# Patient Record
Sex: Female | Born: 1995 | Race: White | Hispanic: No | Marital: Single | Smoking: Never smoker
Health system: Southern US, Community
[De-identification: ages and names within clinical notes are randomized; demographics above are authoritative.]

## PROBLEM LIST (undated history)

## (undated) DIAGNOSIS — F419 Anxiety disorder, unspecified: Secondary | ICD-10-CM

## (undated) DIAGNOSIS — F909 Attention-deficit hyperactivity disorder, unspecified type: Secondary | ICD-10-CM

## (undated) DIAGNOSIS — R519 Headache, unspecified: Secondary | ICD-10-CM

## (undated) DIAGNOSIS — T7840XA Allergy, unspecified, initial encounter: Secondary | ICD-10-CM

## (undated) DIAGNOSIS — R471 Dysarthria and anarthria: Secondary | ICD-10-CM

## (undated) HISTORY — DX: Attention-deficit hyperactivity disorder, unspecified type: F90.9

## (undated) HISTORY — DX: Dysarthria and anarthria: R47.1

## (undated) HISTORY — DX: Allergy, unspecified, initial encounter: T78.40XA

## (undated) HISTORY — PX: MOUTH SURGERY: SHX715

---
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
## 2016-04-19 ENCOUNTER — Encounter: Payer: Self-pay | Admitting: Women's Health

## 2016-04-19 ENCOUNTER — Ambulatory Visit (INDEPENDENT_AMBULATORY_CARE_PROVIDER_SITE_OTHER): Payer: 59 | Admitting: Women's Health

## 2016-04-19 VITALS — BP 110/80 | Ht 63.0 in | Wt 145.0 lb

## 2016-04-19 DIAGNOSIS — Z113 Encounter for screening for infections with a predominantly sexual mode of transmission: Secondary | ICD-10-CM

## 2016-04-19 DIAGNOSIS — Z01419 Encounter for gynecological examination (general) (routine) without abnormal findings: Secondary | ICD-10-CM

## 2016-04-19 DIAGNOSIS — Z30011 Encounter for initial prescription of contraceptive pills: Secondary | ICD-10-CM

## 2016-04-19 DIAGNOSIS — Z23 Encounter for immunization: Secondary | ICD-10-CM

## 2016-04-19 MED ORDER — NORETHIN ACE-ETH ESTRAD-FE 1-20 MG-MCG PO TABS: 1.0000 | ORAL_TABLET | Freq: Every day | ORAL | 4 refills | Status: DC

## 2016-04-19 NOTE — Addendum Note (Signed)
Addended by: Kem Parkinson on: 04/19/2016 04:41 PM   Modules accepted: Orders

## 2016-04-19 NOTE — Patient Instructions (Signed)
Health Maintenance, Female Adopting a healthy lifestyle and getting preventive care can go a long way to promote health and wellness. Talk with your health care provider about what schedule of regular examinations is right for you. This is a good chance for you to check in with your provider about disease prevention and staying healthy. In between checkups, there are plenty of things you can do on your own. Experts have done a lot of research about which lifestyle changes and preventive measures are most likely to keep you healthy. Ask your health care provider for more information. WEIGHT AND DIET  Eat a healthy diet  Be sure to include plenty of vegetables, fruits, low-fat dairy products, and lean protein.  Do not eat a lot of foods high in solid fats, added sugars, or salt.  Get regular exercise. This is one of the most important things you can do for your health.  Most adults should exercise for at least 150 minutes each week. The exercise should increase your heart rate and make you sweat (moderate-intensity exercise).  Most adults should also do strengthening exercises at least twice a week. This is in addition to the moderate-intensity exercise.  Maintain a healthy weight  Body mass index (BMI) is a measurement that can be used to identify possible weight problems. It estimates body fat based on height and weight. Your health care provider can help determine your BMI and help you achieve or maintain a healthy weight.  For females 20 years of age and older:   A BMI below 18.5 is considered underweight.  A BMI of 18.5 to 24.9 is normal.  A BMI of 25 to 29.9 is considered overweight.  A BMI of 30 and above is considered obese.  Watch levels of cholesterol and blood lipids  You should start having your blood tested for lipids and cholesterol at 20 years of age, then have this test every 5 years.  You may need to have your cholesterol levels checked more often if:  Your lipid  or cholesterol levels are high.  You are older than 19 years of age.  You are at high risk for heart disease.  CANCER SCREENING   Lung Cancer  Lung cancer screening is recommended for adults 55-80 years old who are at high risk for lung cancer because of a history of smoking.  A yearly low-dose CT scan of the lungs is recommended for people who:  Currently smoke.  Have quit within the past 15 years.  Have at least a 30-pack-year history of smoking. A pack year is smoking an average of one pack of cigarettes a day for 1 year.  Yearly screening should continue until it has been 15 years since you quit.  Yearly screening should stop if you develop a health problem that would prevent you from having lung cancer treatment.  Breast Cancer  Practice breast self-awareness. This means understanding how your breasts normally appear and feel.  It also means doing regular breast self-exams. Let your health care provider know about any changes, no matter how small.  If you are in your 20s or 30s, you should have a clinical breast exam (CBE) by a health care provider every 1-3 years as part of a regular health exam.  If you are 40 or older, have a CBE every year. Also consider having a breast X-ray (mammogram) every year.  If you have a family history of breast cancer, talk to your health care provider about genetic screening.  If you   are at high risk for breast cancer, talk to your health care provider about having an MRI and a mammogram every year.  Breast cancer gene (BRCA) assessment is recommended for women who have family members with BRCA-related cancers. BRCA-related cancers include:  Breast.  Ovarian.  Tubal.  Peritoneal cancers.  Results of the assessment will determine the need for genetic counseling and BRCA1 and BRCA2 testing. Cervical Cancer Your health care provider may recommend that you be screened regularly for cancer of the pelvic organs (ovaries, uterus, and  vagina). This screening involves a pelvic examination, including checking for microscopic changes to the surface of your cervix (Pap test). You may be encouraged to have this screening done every 3 years, beginning at age 21.  For women ages 30-65, health care providers may recommend pelvic exams and Pap testing every 3 years, or they may recommend the Pap and pelvic exam, combined with testing for human papilloma virus (HPV), every 5 years. Some types of HPV increase your risk of cervical cancer. Testing for HPV may also be done on women of any age with unclear Pap test results.  Other health care providers may not recommend any screening for nonpregnant women who are considered low risk for pelvic cancer and who do not have symptoms. Ask your health care provider if a screening pelvic exam is right for you.  If you have had past treatment for cervical cancer or a condition that could lead to cancer, you need Pap tests and screening for cancer for at least 20 years after your treatment. If Pap tests have been discontinued, your risk factors (such as having a new sexual partner) need to be reassessed to determine if screening should resume. Some women have medical problems that increase the chance of getting cervical cancer. In these cases, your health care provider may recommend more frequent screening and Pap tests. Colorectal Cancer  This type of cancer can be detected and often prevented.  Routine colorectal cancer screening usually begins at 19 years of age and continues through 19 years of age.  Your health care provider may recommend screening at an earlier age if you have risk factors for colon cancer.  Your health care provider may also recommend using home test kits to check for hidden blood in the stool.  A small camera at the end of a tube can be used to examine your colon directly (sigmoidoscopy or colonoscopy). This is done to check for the earliest forms of colorectal  cancer.  Routine screening usually begins at age 50.  Direct examination of the colon should be repeated every 5-10 years through 19 years of age. However, you may need to be screened more often if early forms of precancerous polyps or small growths are found. Skin Cancer  Check your skin from head to toe regularly.  Tell your health care provider about any new moles or changes in moles, especially if there is a change in a mole's shape or color.  Also tell your health care provider if you have a mole that is larger than the size of a pencil eraser.  Always use sunscreen. Apply sunscreen liberally and repeatedly throughout the day.  Protect yourself by wearing long sleeves, pants, a wide-brimmed hat, and sunglasses whenever you are outside. HEART DISEASE, DIABETES, AND HIGH BLOOD PRESSURE   High blood pressure causes heart disease and increases the risk of stroke. High blood pressure is more likely to develop in:  People who have blood pressure in the high end   of the normal range (130-139/85-89 mm Hg).  People who are overweight or obese.  People who are African American.  If you are 38-23 years of age, have your blood pressure checked every 3-5 years. If you are 61 years of age or older, have your blood pressure checked every year. You should have your blood pressure measured twice--once when you are at a hospital or clinic, and once when you are not at a hospital or clinic. Record the average of the two measurements. To check your blood pressure when you are not at a hospital or clinic, you can use:  An automated blood pressure machine at a pharmacy.  A home blood pressure monitor.  If you are between 45 years and 39 years old, ask your health care provider if you should take aspirin to prevent strokes.  Have regular diabetes screenings. This involves taking a blood sample to check your fasting blood sugar level.  If you are at a normal weight and have a low risk for diabetes,  have this test once every three years after 19 years of age.  If you are overweight and have a high risk for diabetes, consider being tested at a younger age or more often. PREVENTING INFECTION  Hepatitis B  If you have a higher risk for hepatitis B, you should be screened for this virus. You are considered at high risk for hepatitis B if:  You were born in a country where hepatitis B is common. Ask your health care provider which countries are considered high risk.  Your parents were born in a high-risk country, and you have not been immunized against hepatitis B (hepatitis B vaccine).  You have HIV or AIDS.  You use needles to inject street drugs.  You live with someone who has hepatitis B.  You have had sex with someone who has hepatitis B.  You get hemodialysis treatment.  You take certain medicines for conditions, including cancer, organ transplantation, and autoimmune conditions. Hepatitis C  Blood testing is recommended for:  Everyone born from [birth year] through 1964.  Anyone with known risk factors for hepatitis C. Sexually transmitted infections (STIs)  You should be screened for sexually transmitted infections (STIs) including gonorrhea and chlamydia if:  You are sexually active and are younger than 19 years of age.  You are older than 19 years of age and your health care provider tells you that you are at risk for this type of infection.  Your sexual activity has changed since you were last screened and you are at an increased risk for chlamydia or gonorrhea. Ask your health care provider if you are at risk.  If you do not have HIV, but are at risk, it may be recommended that you take a prescription medicine daily to prevent HIV infection. This is called pre-exposure prophylaxis (PrEP). You are considered at risk if:  You are sexually active and do not regularly use condoms or know the HIV status of your partner(s).  You take drugs by injection.  You are sexually  active with a partner who has HIV. Talk with your health care provider about whether you are at high risk of being infected with HIV. If you choose to begin PrEP, you should first be tested for HIV. You should then be tested every 3 months for as long as you are taking PrEP.  PREGNANCY   If you are premenopausal and you may become pregnant, ask your health care provider about preconception counseling.  If you may  become pregnant, take 400 to 800 micrograms (mcg) of folic acid every day.  If you want to prevent pregnancy, talk to your health care provider about birth control (contraception). OSTEOPOROSIS AND MENOPAUSE   Osteoporosis is a disease in which the bones lose minerals and strength with aging. This can result in serious bone fractures. Your risk for osteoporosis can be identified using a bone density scan.  If you are 65 years of age or older, or if you are at risk for osteoporosis and fractures, ask your health care provider if you should be screened.  Ask your health care provider whether you should take a calcium or vitamin D supplement to lower your risk for osteoporosis.  Menopause may have certain physical symptoms and risks.  Hormone replacement therapy may reduce some of these symptoms and risks. Talk to your health care provider about whether hormone replacement therapy is right for you.  HOME CARE INSTRUCTIONS   Schedule regular health, dental, and eye exams.  Stay current with your immunizations.   Do not use any tobacco products including cigarettes, chewing tobacco, or electronic cigarettes.  If you are pregnant, do not drink alcohol.  If you are breastfeeding, limit how much and how often you drink alcohol.  Limit alcohol intake to no more than 1 drink per day for nonpregnant women. One drink equals 12 ounces of beer, 5 ounces of wine, or 1 ounces of hard liquor.  Do not use street drugs.  Do not share needles.  Ask your health care provider for help if  you need support or information about quitting drugs.  Tell your health care provider if you often feel depressed.  Tell your health care provider if you have ever been abused or do not feel safe at home.   This information is not intended to replace advice given to you by your health care provider. Make sure you discuss any questions you have with your health care provider.   Document Released: 03/21/2011 Document Revised: 09/26/2014 Document Reviewed: 08/07/2013 Elsevier Interactive Patient Education 2016 Elsevier Inc.  HPV (Human Papillomavirus) Vaccine--Gardasil-9:  1. Why get vaccinated? Gardasil-9 prevents human papillomavirus (HPV) types that cause many cancers, including:  cervical cancer in females,  vaginal and vulvar cancers in females,  anal cancer in females and males,  throat cancer in females and males, and  penile cancer in males. In addition, Gardasil-9 prevents HPV types that cause genital warts in both females and males. In the [place] about 12,000 women get cervical cancer every year, and about 4,000 women die from it. Gardasil-9 can prevent most of these cases of cervical cancer. Vaccination is not a substitute for cervical cancer screening. This vaccine does not protect against all HPV types that can cause cervical cancer. Women should still get regular Pap tests. HPV infection usually comes from sexual contact, and most people will become infected at some point in their life. About 14 million Americans, including teens, get infected every year. Most infections will go away and not cause serious problems. But thousands of women and men get cancer and diseases from HPV. 2. HPV vaccine Gardasil-9 is an FDA-approved HPV vaccine. It is recommended for both males and females. It is routinely given at 11 or 19 years of age, but it may be given beginning at age 9 years through age 26 years. Three doses of Gardasil-9 are recommended with the second dose given 1-2 months  after the first dose and the third dose given 6 months after the first   dose. 3. Some people should not get this vaccine  Anyone who has had a severe, life-threatening allergic reaction to a dose of HPV vaccine should not get another dose.  Anyone who has a severe (life threatening) allergy to any component of HPV vaccine should not get the vaccine. Tell your doctor if you have any severe allergies that you know of, including a severe allergy to yeast.  HPV vaccine is not recommended for pregnant women. If you learn that you were pregnant when you were vaccinated, there is no reason to expect any problems for you or your baby. Any woman who learns she was pregnant when she got Gardasil-9 vaccine is encouraged to contact the manufacturer's registry for HPV vaccination during pregnancy at 1-800-986-8999. Women who are breastfeeding may be vaccinated.  If you have a mild illness, such as a cold, you can probably get the vaccine today. If you are moderately or severely ill, you should probably wait until you recover. Your doctor can advise you. 4. Risks of a vaccine reaction With any medicine, including vaccines, there is a chance of side effects. These are usually mild and go away on their own, but serious reactions are also possible. Most people who get HPV vaccine do not have any serious problems with it. Mild or moderate problems following Gardasil-9:  Reactions in the arm where the shot was given:  Soreness (about 9 people in 10)  Redness or swelling (about 1 person in 3)  Fever:  Mild (100F) (about 1 person in 10)  Moderate (102F) (about 1 person in 65)  Other problems:  Headache (about 1 person in 3) Problems that could happen after any injected vaccine:  People sometimes faint after a medical procedure, including vaccination. Sitting or lying down for about 15 minutes can help prevent fainting, and injuries caused by a fall. Tell your doctor if you feel dizzy, or have vision  changes or ringing in the ears.  Some people get severe pain in the shoulder and have difficulty moving the arm where a shot was given. This happens very rarely.  Any medication can cause a severe allergic reaction. Such reactions from a vaccine are very rare, estimated at about 1 in a million doses, and would happen within a few minutes to a few hours after the vaccination. As with any medicine, there is a very remote chance of a vaccine causing a serious injury or death. The safety of vaccines is always being monitored. For more information, visit: www.cdc.gov/vaccinesafety/. 5. What if there is a serious reaction? What should I look for? Look for anything that concerns you, such as signs of a severe allergic reaction, very high fever, or unusual behavior. Signs of a severe allergic reaction can include hives, swelling of the face and throat, difficulty breathing, a fast heartbeat, dizziness, and weakness. These would usually start a few minutes to a few hours after the vaccination. What should I do? If you think it is a severe allergic reaction or other emergency that can't wait, call 9-1-1 or get to the nearest hospital. Otherwise, call your doctor. Afterward, the reaction should be reported to the "Vaccine Adverse Event Reporting System" (VAERS). Your doctor might file this report, or you can do it yourself through the VAERS web site at www.vaers.hhs.gov, or by calling 1-800-822-7967. VAERS does not give medical advice. 6. The National Vaccine Injury Compensation Program The National Vaccine Injury Compensation Program (VICP) is a federal program that was created to compensate people who may have been   injured by certain vaccines. Persons who believe they may have been injured by a vaccine can learn about the program and about filing a claim by calling 1-800-338-2382 or visiting the VICP website at www.hrsa.gov/vaccinecompensation. There is a time limit to file a claim for compensation. 7. How  can I learn more?  Ask your health care provider. He or she can give you the vaccine package insert or suggest other sources of information.  Call your local or state health department.  Contact the Centers for Disease Control and Prevention (CDC):  Call 1-800-232-4636 (1-800-CDC-INFO) or  Visit CDC's website at www.cdc.gov/hpv Vaccine Information Statement HPV Vaccine (Gardasil-9) 12/18/14   This information is not intended to replace advice given to you by your health care provider. Make sure you discuss any questions you have with your health care provider.   Document Released: 04/02/2014 Document Revised: 01/20/2015 Document Reviewed: 04/02/2014 Elsevier Interactive Patient Education 2016 Elsevier Inc.  

## 2016-04-19 NOTE — Progress Notes (Signed)
Lisa Duncan 1996/01/12 [id number]    History:    Presents for annual exam.  Monthly cycles/condoms/new partner. Has not received gardasil.  Past medical history, past surgical history, family history and social history were all reviewed and documented in the EPIC chart. 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  ROS:  A ROS was performed and pertinent positives and negatives are included.  Exam:  Vitals:   04/19/16 1428  BP: 110/80     General appearance:  Normal Thyroid:  Symmetrical, normal in size, without palpable masses or nodularity. Respiratory  Auscultation:  Clear without wheezing or rhonchi Cardiovascular  Auscultation:  Regular rate, without rubs, murmurs or gallops  Edema/varicosities:  Not grossly evident Abdominal  Soft,nontender, without masses, guarding or rebound.  Liver/spleen:  No organomegaly noted  Hernia:  None appreciated  Skin  Inspection:  Grossly normal   Breasts: Examined lying and sitting.     Right: Without masses, retractions, discharge or axillary adenopathy.     Left: Without masses, retractions, discharge or axillary adenopathy. Gentitourinary   Inguinal/mons:  Normal without inguinal adenopathy  External genitalia:  Normal  BUS/Urethra/Skene's glands:  Normal  Vagina:  Normal  Cervix:  Normal  Uterus:  normal in size, shape and contour.  Midline and mobile  Adnexa/parametria:     Rt: Without masses or tenderness.   Lt: Without masses or tenderness.  Anus and perineum: Normal    Assessment/Plan:  20 y.o. SWF G0 for annual exam with no complaints.  Monthly cycle/condoms STD screen Contraception management Gardasil  Plan: Contraception options reviewed, would like to try a pill, Loestrin 1/20 prescription, proper use, slight risk for blood clots and strokes reviewed start up instructions reviewed. Importance of condoms especially first month and for infection control reviewed. SBE's, exercise, calcium rich diet, MVI daily encouraged.  Campus and dating safety reviewed. CBC, UA, GC/Chlamydia, HIV, hep B, C, RPR. First gardasil given today, instructed to return to office in 2 and 6 months to complete series.    Harrington Challenger [facility], 3:04 PM 04/19/2016

## 2016-04-20 ENCOUNTER — Telehealth: Payer: Self-pay | Admitting: *Deleted

## 2016-04-20 LAB — URINALYSIS W MICROSCOPIC + REFLEX CULTURE
Bacteria, UA: NONE SEEN [HPF]
Bilirubin Urine: NEGATIVE
CASTS: NONE SEEN [LPF]
CRYSTALS: NONE SEEN [HPF]
Glucose, UA: NEGATIVE
HGB URINE DIPSTICK: NEGATIVE
KETONES UR: NEGATIVE
Leukocytes, UA: NEGATIVE
Nitrite: NEGATIVE
PROTEIN: NEGATIVE
RBC / HPF: NONE SEEN RBC/HPF (ref <=2)
Specific Gravity, Urine: 1.023 (ref 1.001–1.035)
WBC, UA: NONE SEEN WBC/HPF (ref <=5)
Yeast: NONE SEEN [HPF]
pH: 6 (ref 5.0–8.0)

## 2016-04-20 LAB — CBC WITH DIFFERENTIAL/PLATELET
BASOS PCT: 1 %
Basophils Absolute: 69 cells/uL (ref 0–200)
EOS PCT: 2 %
Eosinophils Absolute: 138 cells/uL (ref 15–500)
HCT: 38.1 % (ref 35.0–45.0)
HEMOGLOBIN: 12.6 g/dL (ref 11.7–15.5)
LYMPHS PCT: 39 %
Lymphs Abs: 2691 cells/uL (ref 850–3900)
MCH: 28 pg (ref 27.0–33.0)
MCHC: 33.1 g/dL (ref 32.0–36.0)
MCV: 84.7 fL (ref 80.0–100.0)
MONO ABS: 483 {cells}/uL (ref 200–950)
MPV: 9.4 fL (ref 7.5–12.5)
Monocytes Relative: 7 %
NEUTROS PCT: 51 %
Neutro Abs: 3519 cells/uL (ref 1500–7800)
Platelets: 360 10*3/uL (ref 140–400)
RBC: 4.5 MIL/uL (ref 3.80–5.10)
RDW: 13.6 % (ref 11.0–15.0)
WBC: 6.9 10*3/uL (ref 3.8–10.8)

## 2016-04-20 LAB — RPR

## 2016-04-20 LAB — GC/CHLAMYDIA PROBE AMP
CT PROBE, AMP APTIMA: NOT DETECTED
GC PROBE AMP APTIMA: NOT DETECTED

## 2016-04-20 LAB — HIV ANTIBODY (ROUTINE TESTING W REFLEX): HIV 1&2 Ab, 4th Generation: NONREACTIVE

## 2016-04-20 LAB — HEPATITIS B SURFACE ANTIGEN: HEP B S AG: NEGATIVE

## 2016-04-20 LAB — HEPATITIS C ANTIBODY: HCV Ab: NEGATIVE

## 2016-04-20 NOTE — Telephone Encounter (Signed)
Pt called states OCP too expensive over $100.  Her sister gets Loestrin 1.5/30 for $25 ok to call in? pls advise KW CMA

## 2016-04-20 NOTE — Telephone Encounter (Signed)
Yes okay to call and it is minimally different than what was originally prescribed both are generic Loestrin. Ortho-Cyclen/Sprintec could be less expensive possibly $10-$12 at [facility] even without insurance would be fine also. Both are good pills.

## 2016-04-21 MED ORDER — NORETHIN ACE-ETH ESTRAD-FE 1.5-30 MG-MCG PO TABS: 1.0000 | ORAL_TABLET | Freq: Every day | ORAL | 11 refills | Status: DC

## 2016-04-21 NOTE — Telephone Encounter (Signed)
Rx was sent by Maryelizabeth Rowan

## 2016-04-22 ENCOUNTER — Ambulatory Visit: Payer: Managed Care, Other (non HMO) | Admitting: Family Medicine

## 2016-04-22 DIAGNOSIS — Z0289 Encounter for other administrative examinations: Secondary | ICD-10-CM

## 2016-06-20 ENCOUNTER — Telehealth: Payer: Self-pay | Admitting: *Deleted

## 2016-06-20 MED ORDER — DROSPIRENONE-ETHINYL ESTRADIOL 3-0.02 MG PO TABS: 1.0000 | ORAL_TABLET | Freq: Every day | ORAL | 11 refills | Status: DC

## 2016-06-20 NOTE — Telephone Encounter (Signed)
Ok, have her try Yaz, please call in rx for her, instruct to complete current pak, then start.  thanks

## 2016-06-20 NOTE — Telephone Encounter (Signed)
Pt informed with the below note, Rx sent. 

## 2016-06-20 NOTE — Telephone Encounter (Signed)
Pt was prescribed microgestin in august stating pills have made her feel very emotional, would prefer to switch to different pill. Please advise

## 2016-06-24 ENCOUNTER — Other Ambulatory Visit: Payer: Self-pay | Admitting: Women's Health

## 2016-06-24 ENCOUNTER — Ambulatory Visit (INDEPENDENT_AMBULATORY_CARE_PROVIDER_SITE_OTHER): Payer: 59 | Admitting: *Deleted

## 2016-06-24 DIAGNOSIS — Z23 Encounter for immunization: Secondary | ICD-10-CM

## 2016-06-24 MED ORDER — NORGESTIMATE-ETH ESTRADIOL 0.25-35 MG-MCG PO TABS: 1.0000 | ORAL_TABLET | Freq: Every day | ORAL | 12 refills | Status: DC

## 2016-10-18 ENCOUNTER — Telehealth: Payer: Self-pay | Admitting: *Deleted

## 2016-10-18 MED ORDER — NORETHINDRONE ACET-ETHINYL EST 1-20 MG-MCG PO TABS: 1.0000 | ORAL_TABLET | Freq: Every day | ORAL | 7 refills | Status: DC

## 2016-10-18 NOTE — Telephone Encounter (Signed)
Review Yaz is a 20 g pill should be weight neutral, could try OCP with a different progestin, Loestrin 1/20 one daily, refills until next annual exam. Continue healthy lifestyle.

## 2016-10-18 NOTE — Telephone Encounter (Signed)
Pt informed with the below note, Rx sent. 

## 2016-10-18 NOTE — Telephone Encounter (Signed)
Pt has been taking Yaz since Oct. 2017 would like to switch to another birth control, states she has gained weight, was 145lb, now 160lb. Pt said she works out, eating good. Please advise

## 2016-10-21 ENCOUNTER — Ambulatory Visit (INDEPENDENT_AMBULATORY_CARE_PROVIDER_SITE_OTHER): Payer: BLUE CROSS/BLUE SHIELD | Admitting: Anesthesiology

## 2016-10-21 DIAGNOSIS — Z23 Encounter for immunization: Secondary | ICD-10-CM

## 2017-02-01 ENCOUNTER — Encounter: Payer: Self-pay | Admitting: Gynecology

## 2017-04-13 ENCOUNTER — Other Ambulatory Visit: Payer: Self-pay | Admitting: Women's Health

## 2017-04-19 ENCOUNTER — Encounter: Payer: BLUE CROSS/BLUE SHIELD | Admitting: Women's Health

## 2017-05-01 ENCOUNTER — Other Ambulatory Visit: Payer: Self-pay | Admitting: Women's Health

## 2017-05-10 ENCOUNTER — Ambulatory Visit (INDEPENDENT_AMBULATORY_CARE_PROVIDER_SITE_OTHER): Payer: BLUE CROSS/BLUE SHIELD | Admitting: Women's Health

## 2017-05-10 ENCOUNTER — Encounter: Payer: Self-pay | Admitting: Women's Health

## 2017-05-10 VITALS — BP 118/78 | Ht 63.0 in | Wt 161.0 lb

## 2017-05-10 DIAGNOSIS — Z01419 Encounter for gynecological examination (general) (routine) without abnormal findings: Secondary | ICD-10-CM

## 2017-05-10 DIAGNOSIS — Z3041 Encounter for surveillance of contraceptive pills: Secondary | ICD-10-CM

## 2017-05-10 LAB — CBC WITH DIFFERENTIAL/PLATELET
Basophils Absolute: 0 cells/uL (ref 0–200)
Basophils Relative: 0 %
EOS ABS: 73 {cells}/uL (ref 15–500)
Eosinophils Relative: 1 %
HEMATOCRIT: 35.5 % (ref 35.0–45.0)
Hemoglobin: 12.1 g/dL (ref 11.7–15.5)
Lymphocytes Relative: 32 %
Lymphs Abs: 2336 cells/uL (ref 850–3900)
MCH: 29.1 pg (ref 27.0–33.0)
MCHC: 34.1 g/dL (ref 32.0–36.0)
MCV: 85.3 fL (ref 80.0–100.0)
MONO ABS: 365 {cells}/uL (ref 200–950)
MPV: 9.5 fL (ref 7.5–12.5)
Monocytes Relative: 5 %
NEUTROS ABS: 4526 {cells}/uL (ref 1500–7800)
Neutrophils Relative %: 62 %
PLATELETS: 306 10*3/uL (ref 140–400)
RBC: 4.16 MIL/uL (ref 3.80–5.10)
RDW: 12.7 % (ref 11.0–15.0)
WBC: 7.3 10*3/uL (ref 3.8–10.8)

## 2017-05-10 MED ORDER — NORETHINDRONE ACET-ETHINYL EST 1-20 MG-MCG PO TABS: 1.0000 | ORAL_TABLET | Freq: Every day | ORAL | 4 refills | Status: DC

## 2017-05-10 NOTE — Patient Instructions (Signed)

## 2017-05-10 NOTE — Progress Notes (Signed)
Lisa Duncan 03/27/1996 [id number]    History:    Presents for annual exam.  Monthly cycle on Junel. Gardasil series completed. Negative STD screen with current partner.  Past medical history, past surgical history, family history and social history were all reviewed and documented in the EPIC chart. 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  ROS:  A ROS was performed and pertinent positives and negatives are included.  Exam:  Vitals:   05/10/17 0815  BP: 118/78  Weight: 161 lb (73 kg)  Height: 5\' 3"  (1.6 m)   Body mass index is 28.52 kg/m.   General appearance:  Normal Thyroid:  Symmetrical, normal in size, without palpable masses or nodularity. Respiratory  Auscultation:  Clear without wheezing or rhonchi Cardiovascular  Auscultation:  Regular rate, without rubs, murmurs or gallops  Edema/varicosities:  Not grossly evident Abdominal  Soft,nontender, without masses, guarding or rebound.  Liver/spleen:  No organomegaly noted  Hernia:  None appreciated  Skin  Inspection:  Grossly normal   Breasts: Examined lying and sitting.     Right: Without masses, retractions, discharge or axillary adenopathy.     Left: Without masses, retractions, discharge or axillary adenopathy. Gentitourinary   Inguinal/mons:  Normal without inguinal adenopathy  External genitalia:  Normal  BUS/Urethra/Skene's glands:  Normal  Vagina:  Normal  Cervix:  Normal  Uterus:  normal in size, shape and contour.  Midline and mobile  Adnexa/parametria:     Rt: Without masses or tenderness.   Lt: Without masses or tenderness.  Anus and perineum: Normal    Assessment/Plan:  21 y.o. S WF G0  for annual exam with no complaints.  Monthly cycle on OCs  Plan: Junel prescription, proper use, slight risk for blood clots and strikes reviewed. Condoms encouraged until permanent partner. SBE's, exercise, calcium rich diet, MVI daily encouraged. Reviewed importance of decreasing  calories and increasing exercise, has gained 15 pounds in the past year. CBC. Denies need for repeat STD screening/same partner  Harrington Challenger [facility], 8:38 AM 05/10/2017

## 2017-05-11 LAB — URINALYSIS W MICROSCOPIC + REFLEX CULTURE
Bacteria, UA: NONE SEEN [HPF]
Bilirubin Urine: NEGATIVE
CRYSTALS: NONE SEEN [HPF]
Casts: NONE SEEN [LPF]
Glucose, UA: NEGATIVE
Hgb urine dipstick: NEGATIVE
KETONES UR: NEGATIVE
Leukocytes, UA: NEGATIVE
Nitrite: NEGATIVE
Protein, ur: NEGATIVE
RBC / HPF: NONE SEEN RBC/HPF (ref <=2)
SPECIFIC GRAVITY, URINE: 1.022 (ref 1.001–1.035)
WBC, UA: NONE SEEN WBC/HPF (ref <=5)
Yeast: NONE SEEN [HPF]
pH: 6.5 (ref 5.0–8.0)

## 2017-08-20 ENCOUNTER — Other Ambulatory Visit: Payer: Self-pay | Admitting: Women's Health

## 2017-08-20 DIAGNOSIS — Z3041 Encounter for surveillance of contraceptive pills: Secondary | ICD-10-CM

## 2017-12-17 ENCOUNTER — Other Ambulatory Visit: Payer: Self-pay | Admitting: Women's Health

## 2017-12-17 DIAGNOSIS — Z3041 Encounter for surveillance of contraceptive pills: Secondary | ICD-10-CM

## 2018-04-14 ENCOUNTER — Other Ambulatory Visit: Payer: Self-pay | Admitting: Women's Health

## 2018-04-14 DIAGNOSIS — Z3041 Encounter for surveillance of contraceptive pills: Secondary | ICD-10-CM

## 2018-05-20 ENCOUNTER — Other Ambulatory Visit: Payer: Self-pay | Admitting: Women's Health

## 2018-05-20 DIAGNOSIS — Z3041 Encounter for surveillance of contraceptive pills: Secondary | ICD-10-CM

## 2018-05-22 NOTE — Telephone Encounter (Signed)
Appointment on 06/25/18

## 2018-05-23 DIAGNOSIS — J300 Vasomotor rhinitis: Secondary | ICD-10-CM | POA: Diagnosis not present

## 2018-06-25 ENCOUNTER — Encounter: Payer: Self-pay | Admitting: Women's Health

## 2018-06-25 ENCOUNTER — Other Ambulatory Visit: Payer: Self-pay | Admitting: *Deleted

## 2018-06-25 ENCOUNTER — Ambulatory Visit: Payer: BLUE CROSS/BLUE SHIELD | Admitting: Women's Health

## 2018-06-25 VITALS — BP 122/80 | Ht 63.0 in | Wt 169.0 lb

## 2018-06-25 DIAGNOSIS — Z01419 Encounter for gynecological examination (general) (routine) without abnormal findings: Secondary | ICD-10-CM | POA: Diagnosis not present

## 2018-06-25 DIAGNOSIS — Z3041 Encounter for surveillance of contraceptive pills: Secondary | ICD-10-CM

## 2018-06-25 LAB — CBC WITH DIFFERENTIAL/PLATELET
BASOS ABS: 68 {cells}/uL (ref 0–200)
Basophils Relative: 0.8 %
EOS ABS: 128 {cells}/uL (ref 15–500)
Eosinophils Relative: 1.5 %
HCT: 36.9 % (ref 35.0–45.0)
HEMOGLOBIN: 12.4 g/dL (ref 11.7–15.5)
Lymphs Abs: 3060 cells/uL (ref 850–3900)
MCH: 28.6 pg (ref 27.0–33.0)
MCHC: 33.6 g/dL (ref 32.0–36.0)
MCV: 85.2 fL (ref 80.0–100.0)
MPV: 10.1 fL (ref 7.5–12.5)
Monocytes Relative: 5.3 %
Neutro Abs: 4794 cells/uL (ref 1500–7800)
Neutrophils Relative %: 56.4 %
Platelets: 370 10*3/uL (ref 140–400)
RBC: 4.33 10*6/uL (ref 3.80–5.10)
RDW: 12.1 % (ref 11.0–15.0)
TOTAL LYMPHOCYTE: 36 %
WBC mixed population: 451 cells/uL (ref 200–950)
WBC: 8.5 10*3/uL (ref 3.8–10.8)

## 2018-06-25 MED ORDER — NORETHINDRONE ACET-ETHINYL EST 1-20 MG-MCG PO TABS: 1.0000 | ORAL_TABLET | Freq: Every day | ORAL | 4 refills | Status: DC

## 2018-06-25 NOTE — Addendum Note (Signed)
Addended by: Tito Dine on: 06/25/2018 09:15 AM   Modules accepted: Orders
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
## 2018-06-25 NOTE — Patient Instructions (Signed)

## 2018-06-27 LAB — PAP IG W/ RFLX HPV ASCU

## 2018-12-06 DIAGNOSIS — R591 Generalized enlarged lymph nodes: Secondary | ICD-10-CM | POA: Diagnosis not present

## 2019-02-06 DIAGNOSIS — Z03818 Encounter for observation for suspected exposure to other biological agents ruled out: Secondary | ICD-10-CM | POA: Diagnosis not present

## 2019-02-27 DIAGNOSIS — Z20828 Contact with and (suspected) exposure to other viral communicable diseases: Secondary | ICD-10-CM | POA: Diagnosis not present

## 2019-03-21 DIAGNOSIS — Z20828 Contact with and (suspected) exposure to other viral communicable diseases: Secondary | ICD-10-CM | POA: Diagnosis not present

## 2019-04-01 DIAGNOSIS — M79672 Pain in left foot: Secondary | ICD-10-CM | POA: Diagnosis not present

## 2019-04-15 DIAGNOSIS — Z20828 Contact with and (suspected) exposure to other viral communicable diseases: Secondary | ICD-10-CM | POA: Diagnosis not present

## 2019-05-09 DIAGNOSIS — Z20828 Contact with and (suspected) exposure to other viral communicable diseases: Secondary | ICD-10-CM | POA: Diagnosis not present

## 2019-07-02 ENCOUNTER — Other Ambulatory Visit: Payer: Self-pay

## 2019-07-03 ENCOUNTER — Ambulatory Visit: Payer: BC Managed Care – PPO | Admitting: Women's Health

## 2019-07-03 ENCOUNTER — Encounter: Payer: Self-pay | Admitting: Women's Health

## 2019-07-03 VITALS — BP 120/78 | Ht 63.0 in | Wt 176.4 lb

## 2019-07-03 DIAGNOSIS — Z01419 Encounter for gynecological examination (general) (routine) without abnormal findings: Secondary | ICD-10-CM | POA: Diagnosis not present

## 2019-07-03 DIAGNOSIS — Z113 Encounter for screening for infections with a predominantly sexual mode of transmission: Secondary | ICD-10-CM

## 2019-07-03 DIAGNOSIS — Z3041 Encounter for surveillance of contraceptive pills: Secondary | ICD-10-CM | POA: Diagnosis not present

## 2019-07-03 MED ORDER — NORETHINDRONE ACET-ETHINYL EST 1-20 MG-MCG PO TABS: 1.0000 | ORAL_TABLET | Freq: Every day | ORAL | 4 refills | Status: DC

## 2019-07-03 NOTE — Progress Notes (Signed)
Lisa Duncan [birth year] [id number]    History:    Presents for annual exam.  Monthly cycle on Loestrin without complaint.  New partner.  Gardasil series completed.  Normal Pap history.  Lost approximately 30 pounds several years ago has regained 7 in the past year struggling with weight control despite diet and exercise.  Past medical history, past surgical history, family history and social history were all reviewed and documented in the EPIC chart.  Works at [employer].  Was contemplating nursing school.  Parents healthy.  ROS:  A ROS was performed and pertinent positives and negatives are included.  Exam:  Vitals:   07/03/19 0816  BP: 120/78  Weight: 176 lb 6.4 oz (80 kg)  Height: 5\' 3"  (1.6 m)   Body mass index is 31.25 kg/m.   General appearance:  Normal Thyroid:  Symmetrical, normal in size, without palpable masses or nodularity. Respiratory  Auscultation:  Clear without wheezing or rhonchi Cardiovascular  Auscultation:  Regular rate, without rubs, murmurs or gallops  Edema/varicosities:  Not grossly evident Abdominal  Soft,nontender, without masses, guarding or rebound.  Liver/spleen:  No organomegaly noted  Hernia:  None appreciated  Skin  Inspection:  Grossly normal   Breasts: Examined lying and sitting.     Right: Without masses, retractions, discharge or axillary adenopathy.     Left: Without masses, retractions, discharge or axillary adenopathy. Gentitourinary   Inguinal/mons:  Normal without inguinal adenopathy  External genitalia:  Normal  BUS/Urethra/Skene's glands:  Normal  Vagina:  Normal  Cervix:  Normal  Uterus:   normal in size, shape and contour.  Midline and mobile  Adnexa/parametria:     Rt: Without masses or tenderness.   Lt: Without masses or tenderness.  Anus and perineum: Normal    Assessment/Plan:  23 y.o. S WF G0 for annual exam with complaint of difficulty losing weight despite diet and exercise  Regular monthly cycle on  Loestrin STD screen Obesity  Plan: Loestrin 1/20 prescription, proper use, slight risk for blood clots and strokes reviewed.  Encourage condoms until permanent partner.  SBE's, continue healthy lifestyle regular exercise, MVI daily, healthy diet, decrease calories/carbs, weight watchers encouraged..  CBC, GC/chlamydia, HIV, RPR, Pap normal 2019, new screening guidelines reviewed.    Huel Cote [facility], 8:20 AM 07/03/2019

## 2019-07-03 NOTE — Patient Instructions (Signed)
Health Maintenance, Female Adopting a healthy lifestyle and getting preventive care are important in promoting health and wellness. Ask your health care provider about:  The right schedule for you to have regular tests and exams.  Things you can do on your own to prevent diseases and keep yourself healthy. What should I know about diet, weight, and exercise? Eat a healthy diet   Eat a diet that includes plenty of vegetables, fruits, low-fat dairy products, and lean protein.  Do not eat a lot of foods that are high in solid fats, added sugars, or sodium. Maintain a healthy weight Body mass index (BMI) is used to identify weight problems. It estimates body fat based on height and weight. Your health care provider can help determine your BMI and help you achieve or maintain a healthy weight. Get regular exercise Get regular exercise. This is one of the most important things you can do for your health. Most adults should:  Exercise for at least 150 minutes each week. The exercise should increase your heart rate and make you sweat (moderate-intensity exercise).  Do strengthening exercises at least twice a week. This is in addition to the moderate-intensity exercise.  Spend less time sitting. Even light physical activity can be beneficial. Watch cholesterol and blood lipids Have your blood tested for lipids and cholesterol at 22 years of age, then have this test every 5 years. Have your cholesterol levels checked more often if:  Your lipid or cholesterol levels are high.  You are older than 22 years of age.  You are at high risk for heart disease. What should I know about cancer screening? Depending on your health history and family history, you may need to have cancer screening at various ages. This may include screening for:  Breast cancer.  Cervical cancer.  Colorectal cancer.  Skin cancer.  Lung cancer. What should I know about heart disease, diabetes, and high blood  pressure? Blood pressure and heart disease  High blood pressure causes heart disease and increases the risk of stroke. This is more likely to develop in people who have high blood pressure readings, are of African descent, or are overweight.  Have your blood pressure checked: ? Every 3-5 years if you are 18-39 years of age. ? Every year if you are 40 years old or older. Diabetes Have regular diabetes screenings. This checks your fasting blood sugar level. Have the screening done:  Once every three years after age 40 if you are at a normal weight and have a low risk for diabetes.  More often and at a younger age if you are overweight or have a high risk for diabetes. What should I know about preventing infection? Hepatitis B If you have a higher risk for hepatitis B, you should be screened for this virus. Talk with your health care provider to find out if you are at risk for hepatitis B infection. Hepatitis C Testing is recommended for:  Everyone born from 1945 through 1965.  Anyone with known risk factors for hepatitis C. Sexually transmitted infections (STIs)  Get screened for STIs, including gonorrhea and chlamydia, if: ? You are sexually active and are younger than 22 years of age. ? You are older than 22 years of age and your health care provider tells you that you are at risk for this type of infection. ? Your sexual activity has changed since you were last screened, and you are at increased risk for chlamydia or gonorrhea. Ask your health care provider if   you are at risk.  Ask your health care provider about whether you are at high risk for HIV. Your health care provider may recommend a prescription medicine to help prevent HIV infection. If you choose to take medicine to prevent HIV, you should first get tested for HIV. You should then be tested every 3 months for as long as you are taking the medicine. Pregnancy  If you are about to stop having your period (premenopausal) and  you may become pregnant, seek counseling before you get pregnant.  Take 400 to 800 micrograms (mcg) of folic acid every day if you become pregnant.  Ask for birth control (contraception) if you want to prevent pregnancy. Osteoporosis and menopause Osteoporosis is a disease in which the bones lose minerals and strength with aging. This can result in bone fractures. If you are 65 years old or older, or if you are at risk for osteoporosis and fractures, ask your health care provider if you should:  Be screened for bone loss.  Take a calcium or vitamin D supplement to lower your risk of fractures.  Be given hormone replacement therapy (HRT) to treat symptoms of menopause. Follow these instructions at home: Lifestyle  Do not use any products that contain nicotine or tobacco, such as cigarettes, e-cigarettes, and chewing tobacco. If you need help quitting, ask your health care provider.  Do not use street drugs.  Do not share needles.  Ask your health care provider for help if you need support or information about quitting drugs. Alcohol use  Do not drink alcohol if: ? Your health care provider tells you not to drink. ? You are pregnant, may be pregnant, or are planning to become pregnant.  If you drink alcohol: ? Limit how much you use to 0-1 drink a day. ? Limit intake if you are breastfeeding.  Be aware of how much alcohol is in your drink. In the [place] one drink equals one 12 oz bottle of beer (355 mL), one 5 oz glass of wine (148 mL), or one 1 oz glass of hard liquor (44 mL). General instructions  Schedule regular health, dental, and eye exams.  Stay current with your vaccines.  Tell your health care provider if: ? You often feel depressed. ? You have ever been abused or do not feel safe at home. Summary  Adopting a healthy lifestyle and getting preventive care are important in promoting health and wellness.  Follow your health care provider's instructions about healthy  diet, exercising, and getting tested or screened for diseases.  Follow your health care provider's instructions on monitoring your cholesterol and blood pressure. This information is not intended to replace advice given to you by your health care provider. Make sure you discuss any questions you have with your health care provider. Document Released: 03/21/2011 Document Revised: 08/29/2018 Document Reviewed: 08/29/2018 Elsevier Patient Education  2020 Elsevier Inc.  

## 2019-07-04 LAB — CBC WITH DIFFERENTIAL/PLATELET
Absolute Monocytes: 362 cells/uL (ref 200–950)
Basophils Absolute: 57 cells/uL (ref 0–200)
Basophils Relative: 0.8 %
Eosinophils Absolute: 78 cells/uL (ref 15–500)
Eosinophils Relative: 1.1 %
HCT: 37.9 % (ref 35.0–45.0)
Hemoglobin: 12.9 g/dL (ref 11.7–15.5)
Lymphs Abs: 2663 cells/uL (ref 850–3900)
MCH: 29.2 pg (ref 27.0–33.0)
MCHC: 34 g/dL (ref 32.0–36.0)
MCV: 85.7 fL (ref 80.0–100.0)
MPV: 10 fL (ref 7.5–12.5)
Monocytes Relative: 5.1 %
Neutro Abs: 3941 cells/uL (ref 1500–7800)
Neutrophils Relative %: 55.5 %
Platelets: 333 10*3/uL (ref 140–400)
RBC: 4.42 10*6/uL (ref 3.80–5.10)
RDW: 11.8 % (ref 11.0–15.0)
Total Lymphocyte: 37.5 %
WBC: 7.1 10*3/uL (ref 3.8–10.8)

## 2019-07-04 LAB — C. TRACHOMATIS/N. GONORRHOEAE RNA
C. trachomatis RNA, TMA: NOT DETECTED
N. gonorrhoeae RNA, TMA: NOT DETECTED

## 2019-07-04 LAB — HIV ANTIBODY (ROUTINE TESTING W REFLEX): HIV 1&2 Ab, 4th Generation: NONREACTIVE

## 2019-07-04 LAB — RPR: RPR Ser Ql: NONREACTIVE

## 2019-08-16 DIAGNOSIS — F909 Attention-deficit hyperactivity disorder, unspecified type: Secondary | ICD-10-CM | POA: Diagnosis not present

## 2019-09-06 ENCOUNTER — Other Ambulatory Visit: Payer: Managed Care, Other (non HMO)

## 2019-09-09 ENCOUNTER — Telehealth (INDEPENDENT_AMBULATORY_CARE_PROVIDER_SITE_OTHER): Payer: BC Managed Care – PPO | Admitting: Family Medicine

## 2019-09-09 ENCOUNTER — Encounter: Payer: Self-pay | Admitting: Family Medicine

## 2019-09-09 ENCOUNTER — Other Ambulatory Visit: Payer: Self-pay

## 2019-09-09 DIAGNOSIS — F900 Attention-deficit hyperactivity disorder, predominantly inattentive type: Secondary | ICD-10-CM | POA: Diagnosis not present

## 2019-09-09 MED ORDER — AMPHETAMINE-DEXTROAMPHET ER 10 MG PO CP24: 10.0000 mg | ORAL_CAPSULE | Freq: Every day | ORAL | 0 refills | Status: DC

## 2019-09-09 NOTE — Progress Notes (Signed)
Virtual Visit via Video Note  I connected with the patient on 09/09/19 at  8:15 AM EST by a video enabled telemedicine application and verified that I am speaking with the correct person using two identifiers.  Location patient: home Location provider:work or home office Persons participating in the virtual visit: patient, provider  I discussed the limitations of evaluation and management by telemedicine and the availability of in person appointments. The patient expressed understanding and agreed to proceed.   HPI: Here to ask about attention issues. She says all her life she has had trouble focusing on tasks and completing tasks. When she was young in school she had a 504 which allowed her to have extra time to take tests, etc. She has never taken medication for this however. 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 She has trouble getting things done on time and focusing on her work. Otherwise  she has been very healthy.   ROS: See pertinent positives and negatives per HPI.  No past medical history on file.  Past Surgical History:  Procedure Laterality Date  . MOUTH SURGERY      Family History  Problem Relation Age of Onset  . Diabetes Maternal Grandmother   . Heart disease Maternal Grandmother   . Heart disease Maternal Grandfather   . Breast cancer Paternal Grandmother        Age 2's  . Heart disease Paternal Grandfather      Current Outpatient Medications:  .  norethindrone-ethinyl estradiol (JUNEL 1/20) 1-20 MG-MCG tablet, Take 1 tablet by mouth daily., Disp: 3 Package, Rfl: 4  EXAM:  VITALS per patient if applicable:  GENERAL: alert, oriented, appears well and in no acute distress  HEENT: atraumatic, conjunttiva clear, no obvious abnormalities on inspection of external nose and ears  NECK: normal movements of the head and neck  LUNGS: on inspection no signs of respiratory distress, breathing rate appears normal, no obvious gross SOB, gasping or  wheezing  CV: no obvious cyanosis  MS: moves all visible extremities without noticeable abnormality  PSYCH/NEURO: pleasant and cooperative, no obvious depression or anxiety, speech and thought processing grossly intact  ASSESSMENT AND PLAN: ADHD, she will try Adderall XR 10 mg daily. I asked her to have one of her coworkers check her BP daily and then report back to me in 2-3 weeks.  Alysia Penna, MD  Discussed the following assessment and plan:  No diagnosis found.     I discussed the assessment and treatment plan with the patient. The patient was provided an opportunity to ask questions and all were answered. The patient agreed with the plan and demonstrated an understanding of the instructions.   The patient was advised to call back or seek an in-person evaluation if the symptoms worsen or if the condition fails to improve as anticipated.

## 2019-09-17 ENCOUNTER — Ambulatory Visit: Payer: BC Managed Care – PPO | Admitting: Family Medicine

## 2019-09-30 ENCOUNTER — Other Ambulatory Visit: Payer: Self-pay

## 2019-10-01 ENCOUNTER — Encounter: Payer: Self-pay | Admitting: Family Medicine

## 2019-10-01 ENCOUNTER — Ambulatory Visit: Payer: BC Managed Care – PPO | Admitting: Family Medicine

## 2019-10-01 VITALS — BP 130/72 | HR 117 | Temp 98.3°F | Wt 181.8 lb

## 2019-10-01 DIAGNOSIS — Z23 Encounter for immunization: Secondary | ICD-10-CM

## 2019-10-01 DIAGNOSIS — F900 Attention-deficit hyperactivity disorder, predominantly inattentive type: Secondary | ICD-10-CM

## 2019-10-01 MED ORDER — AMPHETAMINE-DEXTROAMPHET ER 10 MG PO CP24: 10.0000 mg | ORAL_CAPSULE | Freq: Every day | ORAL | 0 refills | Status: DC

## 2019-10-01 NOTE — Progress Notes (Signed)
   Subjective:    Patient ID: Rema Fendt, female    DOB: 10/09/1995, 24 y.o.   MRN: [id number]  HPI Here to follow up on ADHD. She has been taking Adderall XR 10 mg in the mornings that she works, and this has worked out very well for her. She has no side effects, and she gets much more work done now in less time. She feels less stressed because she can organize her time more efficiently. She has not taken it on days she does not work. Her BP has work has been stable around 120/70. She received her first Moderna Covid vaccine one week ago.    Review of Systems  Constitutional: Negative.   Respiratory: Negative.   Cardiovascular: Negative.   Neurological: Negative.        Objective:   Physical Exam Constitutional:      Appearance: Normal appearance.  Cardiovascular:     Rate and Rhythm: Normal rate and regular rhythm.     Pulses: Normal pulses.     Heart sounds: Normal heart sounds.  Pulmonary:     Effort: Pulmonary effort is normal.     Breath sounds: Normal breath sounds.  Neurological:     General: No focal deficit present.     Mental Status: She is alert and oriented to person, place, and time.           Assessment & Plan:  ADHD, doing well on her current medication. We will continue on this.  Gershon Crane, MD

## 2019-10-09 DIAGNOSIS — R0981 Nasal congestion: Secondary | ICD-10-CM | POA: Diagnosis not present

## 2019-10-10 DIAGNOSIS — Z20828 Contact with and (suspected) exposure to other viral communicable diseases: Secondary | ICD-10-CM | POA: Diagnosis not present

## 2019-10-16 ENCOUNTER — Telehealth: Payer: Self-pay | Admitting: Family Medicine

## 2019-10-16 NOTE — Telephone Encounter (Signed)
Pt received her 1st round of COVID vaccine on 09/24/19 and after she tested pos for COVID on 10/07/19. She is not experiencing any of the symptoms as of now. She is suppose to receive her 2nd shot on 10/22/19. Pt would like a call back regarding if she should still go through with the 2nd shot. Pt can be reached at [phone]

## 2019-10-17 NOTE — Telephone Encounter (Signed)
The current CDC guidelines say to wait 45 days after a Covid infection to get either the first or the second vaccine doses. I would postpone the second vaccine shot until 45 days have passed from 10-07-19.

## 2019-10-17 NOTE — Telephone Encounter (Signed)
Spoke with patient. She is aware.

## 2019-10-17 NOTE — Telephone Encounter (Signed)
Please advise 

## 2019-11-10 DIAGNOSIS — M533 Sacrococcygeal disorders, not elsewhere classified: Secondary | ICD-10-CM | POA: Diagnosis not present

## 2019-11-10 DIAGNOSIS — N926 Irregular menstruation, unspecified: Secondary | ICD-10-CM | POA: Diagnosis not present

## 2019-11-11 DIAGNOSIS — M533 Sacrococcygeal disorders, not elsewhere classified: Secondary | ICD-10-CM | POA: Diagnosis not present

## 2019-11-11 DIAGNOSIS — M545 Low back pain: Secondary | ICD-10-CM | POA: Diagnosis not present

## 2019-11-12 DIAGNOSIS — M533 Sacrococcygeal disorders, not elsewhere classified: Secondary | ICD-10-CM | POA: Diagnosis not present

## 2019-11-12 DIAGNOSIS — M545 Low back pain: Secondary | ICD-10-CM | POA: Diagnosis not present

## 2019-11-13 DIAGNOSIS — M545 Low back pain: Secondary | ICD-10-CM | POA: Diagnosis not present

## 2019-11-15 DIAGNOSIS — M545 Low back pain: Secondary | ICD-10-CM | POA: Diagnosis not present

## 2020-02-14 DIAGNOSIS — Z20828 Contact with and (suspected) exposure to other viral communicable diseases: Secondary | ICD-10-CM | POA: Diagnosis not present

## 2020-02-14 DIAGNOSIS — Z03818 Encounter for observation for suspected exposure to other biological agents ruled out: Secondary | ICD-10-CM | POA: Diagnosis not present

## 2020-02-19 ENCOUNTER — Telehealth: Payer: BC Managed Care – PPO | Admitting: Family Medicine

## 2020-02-21 ENCOUNTER — Telehealth: Payer: BC Managed Care – PPO | Admitting: Family Medicine

## 2020-03-18 ENCOUNTER — Telehealth: Payer: Self-pay | Admitting: Family Medicine

## 2020-03-18 MED ORDER — AMPHETAMINE-DEXTROAMPHET ER 10 MG PO CP24: 10.0000 mg | ORAL_CAPSULE | Freq: Every day | ORAL | 0 refills | Status: DC

## 2020-03-18 NOTE — Telephone Encounter (Signed)
Last filled 12/07/2019 Last OV 10/01/2019

## 2020-03-18 NOTE — Telephone Encounter (Signed)
Done

## 2020-03-18 NOTE — Telephone Encounter (Signed)
Left a detailed message on verified voice mail informing the patient that her refills have been sent in.

## 2020-03-18 NOTE — Telephone Encounter (Addendum)
Medication Refill:  ADDERALL 10 MG 30 capsules  Last OV: 10/10/19 Last Fill: 12/07/19  Pharmacy:  [id number] [place], [place] - [address] Phone:  [phone]  Fax:  [phone]

## 2020-04-29 ENCOUNTER — Telehealth: Payer: Self-pay | Admitting: *Deleted

## 2020-04-29 MED ORDER — AMPHETAMINE-DEXTROAMPHET ER 20 MG PO CP24: 20.0000 mg | ORAL_CAPSULE | ORAL | 0 refills | Status: DC

## 2020-04-29 NOTE — Telephone Encounter (Signed)
Done. Please call the pharmacy to cancel any remaining prescriptions for the 10 mg dose

## 2020-04-29 NOTE — Telephone Encounter (Signed)
Patient is aware that new prescriptions for 20mg  has been sent in.  Spoke with the pharmacy. All remaining 10mg  scripts have been canceled

## 2020-04-29 NOTE — Telephone Encounter (Signed)
Patient called after hours line. Patient reports she want to know if her dosage of Adderall can be increased from 10mg  to 20mg  and she would also like it refilled. Last OV was 10/01/2019 for ADHD.

## 2020-05-29 ENCOUNTER — Other Ambulatory Visit: Payer: Self-pay

## 2020-06-01 ENCOUNTER — Ambulatory Visit: Payer: BC Managed Care – PPO | Admitting: Family Medicine

## 2020-06-01 ENCOUNTER — Encounter: Payer: Self-pay | Admitting: Family Medicine

## 2020-06-01 ENCOUNTER — Other Ambulatory Visit: Payer: Self-pay

## 2020-06-01 VITALS — BP 120/80 | HR 75 | Temp 98.9°F | Wt 182.6 lb

## 2020-06-01 DIAGNOSIS — Z23 Encounter for immunization: Secondary | ICD-10-CM

## 2020-06-01 DIAGNOSIS — R471 Dysarthria and anarthria: Secondary | ICD-10-CM | POA: Diagnosis not present

## 2020-06-01 NOTE — Progress Notes (Signed)
   Subjective:    Patient ID: Lisa Duncan, female    DOB: July 10, 1996, 24 y.o.   MRN: [id number]  HPI Here to discuss some speech problems she has had since late January. She had her first Moderna Covid vaccine on 09-24-19 and the second one on 10-22-19. 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 Several days later she developed severe fatigue and fever to 103.5 degrees. The fever lasted 2 days and the fatigue about a week. She tested positive for the Covid-19 virus on 10-16-19. However along with this she developed a problem with finding the words she wanted to say, problems vocalizing the words, and "stuttering" where the wordd would not come out clearly. This has continued to happen intermittently ever since then. No other neurologic deficits.    Review of Systems  Constitutional: Negative.   Respiratory: Negative.   Cardiovascular: Negative.   Neurological: Positive for speech difficulty. Negative for dizziness, tremors, seizures, syncope, facial asymmetry, weakness, light-headedness, numbness and headaches.       Objective:   Physical Exam Constitutional:      General: She is not in acute distress.    Appearance: Normal appearance.  Cardiovascular:     Rate and Rhythm: Normal rate and regular rhythm.     Pulses: Normal pulses.     Heart sounds: Normal heart sounds.  Pulmonary:     Effort: Pulmonary effort is normal.     Breath sounds: Normal breath sounds.  Neurological:     General: No focal deficit present.     Mental Status: She is alert and oriented to person, place, and time. Mental status is at baseline.     Cranial Nerves: No cranial nerve deficit.     Sensory: No sensory deficit.     Motor: No weakness.     Coordination: Coordination normal.     Gait: Gait normal.     Deep Tendon Reflexes: Reflexes normal.           Assessment & Plan:  Speech problems after a Covid infection. We will refer her to Neurology to evaluate.    Gershon Crane, MD

## 2020-06-01 NOTE — Addendum Note (Signed)
Addended by: Wilford Corner on: 06/01/2020 02:37 PM   Modules accepted: Orders

## 2020-06-15 ENCOUNTER — Encounter: Payer: Self-pay | Admitting: Family Medicine

## 2020-06-19 DIAGNOSIS — R319 Hematuria, unspecified: Secondary | ICD-10-CM | POA: Diagnosis not present

## 2020-06-19 DIAGNOSIS — R300 Dysuria: Secondary | ICD-10-CM | POA: Diagnosis not present

## 2020-06-19 DIAGNOSIS — N926 Irregular menstruation, unspecified: Secondary | ICD-10-CM | POA: Diagnosis not present

## 2020-06-19 DIAGNOSIS — R350 Frequency of micturition: Secondary | ICD-10-CM | POA: Diagnosis not present

## 2020-06-23 DIAGNOSIS — Z20822 Contact with and (suspected) exposure to covid-19: Secondary | ICD-10-CM | POA: Diagnosis not present

## 2020-06-23 DIAGNOSIS — Z03818 Encounter for observation for suspected exposure to other biological agents ruled out: Secondary | ICD-10-CM | POA: Diagnosis not present

## 2020-07-08 ENCOUNTER — Encounter: Payer: BC Managed Care – PPO | Admitting: Nurse Practitioner

## 2020-07-08 ENCOUNTER — Other Ambulatory Visit: Payer: Self-pay

## 2020-08-06 ENCOUNTER — Ambulatory Visit (INDEPENDENT_AMBULATORY_CARE_PROVIDER_SITE_OTHER): Payer: BC Managed Care – PPO | Admitting: Neurology

## 2020-08-06 ENCOUNTER — Encounter: Payer: Self-pay | Admitting: Neurology

## 2020-08-06 VITALS — BP 105/65 | HR 73 | Ht 63.0 in | Wt 184.5 lb

## 2020-08-06 DIAGNOSIS — F900 Attention-deficit hyperactivity disorder, predominantly inattentive type: Secondary | ICD-10-CM | POA: Diagnosis not present

## 2020-08-06 DIAGNOSIS — R7989 Other specified abnormal findings of blood chemistry: Secondary | ICD-10-CM | POA: Diagnosis not present

## 2020-08-06 DIAGNOSIS — R479 Unspecified speech disturbances: Secondary | ICD-10-CM | POA: Diagnosis not present

## 2020-08-06 DIAGNOSIS — R799 Abnormal finding of blood chemistry, unspecified: Secondary | ICD-10-CM | POA: Diagnosis not present

## 2020-08-06 MED ORDER — TRAZODONE HCL 50 MG PO TABS: 100.0000 mg | ORAL_TABLET | Freq: Every day | ORAL | 6 refills | Status: DC
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
## 2020-08-07 LAB — COMPREHENSIVE METABOLIC PANEL
ALT: 44 IU/L — ABNORMAL HIGH (ref 0–32)
AST: 28 IU/L (ref 0–40)
Albumin/Globulin Ratio: 2.2 (ref 1.2–2.2)
Albumin: 4.8 g/dL (ref 3.9–5.0)
Alkaline Phosphatase: 120 IU/L (ref 44–121)
BUN/Creatinine Ratio: 13 (ref 9–23)
BUN: 10 mg/dL (ref 6–20)
Bilirubin Total: 0.9 mg/dL (ref 0.0–1.2)
CO2: 23 mmol/L (ref 20–29)
Calcium: 9.7 mg/dL (ref 8.7–10.2)
Chloride: 101 mmol/L (ref 96–106)
Creatinine, Ser: 0.77 mg/dL (ref 0.57–1.00)
GFR calc Af Amer: 126 mL/min/{1.73_m2} (ref >59)
GFR calc non Af Amer: 109 mL/min/{1.73_m2} (ref >59)
Globulin, Total: 2.2 g/dL (ref 1.5–4.5)
Glucose: 84 mg/dL (ref 65–99)
Potassium: 4.2 mmol/L (ref 3.5–5.2)
Sodium: 137 mmol/L (ref 134–144)
Total Protein: 7 g/dL (ref 6.0–8.5)

## 2020-08-07 LAB — TSH: TSH: 1.56 u[IU]/mL (ref 0.450–4.500)

## 2020-08-28 ENCOUNTER — Other Ambulatory Visit: Payer: Self-pay | Admitting: Neurology

## 2020-09-04 ENCOUNTER — Other Ambulatory Visit: Payer: Self-pay | Admitting: *Deleted

## 2020-09-04 DIAGNOSIS — Z3041 Encounter for surveillance of contraceptive pills: Secondary | ICD-10-CM

## 2020-09-14 ENCOUNTER — Other Ambulatory Visit: Payer: Self-pay | Admitting: Family Medicine

## 2020-09-14 ENCOUNTER — Encounter: Payer: Self-pay | Admitting: Family Medicine

## 2020-09-14 MED ORDER — AMPHETAMINE-DEXTROAMPHET ER 20 MG PO CP24: 20.0000 mg | ORAL_CAPSULE | ORAL | 0 refills | Status: DC

## 2020-09-14 NOTE — Telephone Encounter (Signed)
Pt is calling in needing a refill on Rx amphetamine-dextroamphetamine (ADDERALL XR) 20 MG  [facility] [address]

## 2020-09-14 NOTE — Telephone Encounter (Signed)
Done

## 2020-09-14 NOTE — Telephone Encounter (Addendum)
Patient is calling and is requesting a call back from the provider regarding medication refill for Adderall, please advise. CB is [phone]

## 2020-10-22 ENCOUNTER — Telehealth: Payer: Self-pay | Admitting: Family Medicine

## 2020-10-22 NOTE — Telephone Encounter (Signed)
  amphetamine-dextroamphetamine (ADDERALL XR) 20 MG 24 hr capsu  Is it possible to send a 3 months at a time?  [id number] [place], [place] - [address] Phone:  [phone]  Fax:  [phone]

## 2020-10-26 NOTE — Telephone Encounter (Signed)
Patient notified VIA phone that rx was at the pharmacy that she has to ask for them to get that one ready monthly due to it is no an automatic refill.   She will to the pharmacy to pick it up. Dm/cma  ]

## 2020-10-26 NOTE — Telephone Encounter (Signed)
The patient didn't pick up her Rx in January because the pharmacy never told her that she had a Rx ready.  The pharmacy is not showing that she has any refills available for this medication.  I spoke with Angelique Blonder and she is going to contact the pharmacy.

## 2020-10-26 NOTE — Telephone Encounter (Signed)
Pt requesting refill with 90 day supply.

## 2020-10-26 NOTE — Telephone Encounter (Signed)
She already has refills until March 29

## 2020-11-24 DIAGNOSIS — Z113 Encounter for screening for infections with a predominantly sexual mode of transmission: Secondary | ICD-10-CM | POA: Diagnosis not present

## 2020-11-24 DIAGNOSIS — Z01419 Encounter for gynecological examination (general) (routine) without abnormal findings: Secondary | ICD-10-CM | POA: Diagnosis not present

## 2020-11-24 DIAGNOSIS — Z6831 Body mass index (BMI) 31.0-31.9, adult: Secondary | ICD-10-CM | POA: Diagnosis not present

## 2020-11-24 LAB — HM PAP SMEAR: HM Pap smear: NEGATIVE

## 2020-12-04 DIAGNOSIS — M629 Disorder of muscle, unspecified: Secondary | ICD-10-CM | POA: Diagnosis not present

## 2020-12-04 DIAGNOSIS — Z113 Encounter for screening for infections with a predominantly sexual mode of transmission: Secondary | ICD-10-CM | POA: Diagnosis not present

## 2020-12-04 DIAGNOSIS — R102 Pelvic and perineal pain: Secondary | ICD-10-CM | POA: Diagnosis not present

## 2020-12-04 DIAGNOSIS — N946 Dysmenorrhea, unspecified: Secondary | ICD-10-CM | POA: Diagnosis not present

## 2020-12-25 ENCOUNTER — Emergency Department (HOSPITAL_BASED_OUTPATIENT_CLINIC_OR_DEPARTMENT_OTHER)
Admission: EM | Admit: 2020-12-25 | Discharge: 2020-12-25 | Disposition: A | Discharge status: Home / Self Care | Payer: BC Managed Care – PPO | Attending: Emergency Medicine | Admitting: Emergency Medicine

## 2020-12-25 ENCOUNTER — Encounter (HOSPITAL_BASED_OUTPATIENT_CLINIC_OR_DEPARTMENT_OTHER): Payer: Self-pay

## 2020-12-25 ENCOUNTER — Emergency Department (HOSPITAL_BASED_OUTPATIENT_CLINIC_OR_DEPARTMENT_OTHER): Payer: BC Managed Care – PPO

## 2020-12-25 ENCOUNTER — Other Ambulatory Visit: Payer: Self-pay

## 2020-12-25 DIAGNOSIS — R1013 Epigastric pain: Secondary | ICD-10-CM | POA: Insufficient documentation

## 2020-12-25 DIAGNOSIS — R1011 Right upper quadrant pain: Secondary | ICD-10-CM | POA: Insufficient documentation

## 2020-12-25 LAB — URINALYSIS, ROUTINE W REFLEX MICROSCOPIC
Bilirubin Urine: NEGATIVE
Glucose, UA: NEGATIVE mg/dL
Hgb urine dipstick: NEGATIVE
Ketones, ur: NEGATIVE mg/dL
Leukocytes,Ua: NEGATIVE
Nitrite: NEGATIVE
Protein, ur: NEGATIVE mg/dL
Specific Gravity, Urine: 1.008 (ref 1.005–1.030)
pH: 5.5 (ref 5.0–8.0)

## 2020-12-25 LAB — CBC WITH DIFFERENTIAL/PLATELET
Abs Immature Granulocytes: 0.04 10*3/uL (ref 0.00–0.07)
Basophils Absolute: 0.1 10*3/uL (ref 0.0–0.1)
Basophils Relative: 1 %
Eosinophils Absolute: 0.4 10*3/uL (ref 0.0–0.5)
Eosinophils Relative: 3 %
HCT: 37.8 % (ref 36.0–46.0)
Hemoglobin: 12.9 g/dL (ref 12.0–15.0)
Immature Granulocytes: 0 %
Lymphocytes Relative: 30 %
Lymphs Abs: 3.3 10*3/uL (ref 0.7–4.0)
MCH: 29 pg (ref 26.0–34.0)
MCHC: 34.1 g/dL (ref 30.0–36.0)
MCV: 84.9 fL (ref 80.0–100.0)
Monocytes Absolute: 0.6 10*3/uL (ref 0.1–1.0)
Monocytes Relative: 5 %
Neutro Abs: 6.7 10*3/uL (ref 1.7–7.7)
Neutrophils Relative %: 61 %
Platelets: 338 10*3/uL (ref 150–400)
RBC: 4.45 MIL/uL (ref 3.87–5.11)
RDW: 12.1 % (ref 11.5–15.5)
WBC: 11 10*3/uL — ABNORMAL HIGH (ref 4.0–10.5)
nRBC: 0 % (ref 0.0–0.2)

## 2020-12-25 LAB — PREGNANCY, URINE: Preg Test, Ur: NEGATIVE

## 2020-12-25 LAB — COMPREHENSIVE METABOLIC PANEL
ALT: 43 U/L (ref 0–44)
AST: 31 U/L (ref 15–41)
Albumin: 4.5 g/dL (ref 3.5–5.0)
Alkaline Phosphatase: 82 U/L (ref 38–126)
Anion gap: 8 (ref 5–15)
BUN: 9 mg/dL (ref 6–20)
CO2: 25 mmol/L (ref 22–32)
Calcium: 9.4 mg/dL (ref 8.9–10.3)
Chloride: 102 mmol/L (ref 98–111)
Creatinine, Ser: 0.85 mg/dL (ref 0.44–1.00)
GFR, Estimated: >60 mL/min (ref >60)
Glucose, Bld: 89 mg/dL (ref 70–99)
Potassium: 3.8 mmol/L (ref 3.5–5.1)
Sodium: 135 mmol/L (ref 135–145)
Total Bilirubin: 0.7 mg/dL (ref 0.3–1.2)
Total Protein: 7.3 g/dL (ref 6.5–8.1)

## 2020-12-25 LAB — LIPASE, BLOOD: Lipase: 15 U/L (ref 11–51)

## 2020-12-25 MED ORDER — PANTOPRAZOLE SODIUM 20 MG PO TBEC: 20.0000 mg | DELAYED_RELEASE_TABLET | Freq: Every day | ORAL | 0 refills | Status: DC

## 2020-12-25 NOTE — ED Notes (Signed)
Pt states she is going to stay and wait for Ultrasound.

## 2020-12-25 NOTE — ED Provider Notes (Addendum)
[facility] EMERGENCY DEPT Provider Note   CSN: [id number] Arrival date & time: 12/25/20  1748     History Chief Complaint  Patient presents with  . Abdominal Pain    Lisa Duncan is a 25 y.o. female.  Patient is a 25 year old female who presents with upper abdominal pain.  She states she has been having some intermittent episodes since March but it has gotten worse.  It is in her right upper abdomen.  She has had some nausea but no vomiting.  No fevers.  Today she had some green urine so she was concerned about that.  She says the pain is in her epigastrium and radiates to her right upper quadrant.  She has not take anything for the pain.  She said that she had some labs done by her PCP which showed an elevated "gallbladder lab".  She has not yet had an ultrasound.        Past Medical History:  Diagnosis Date  . ADHD   . Dysarthria    after having Covid-19 in January 2020    Patient Active Problem List   Diagnosis Date Noted  . Difficulty with speech 08/06/2020  . ADHD (attention deficit hyperactivity disorder), inattentive type 09/09/2019  . 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 MOUTH SURGERY       OB History    Gravida  0   Para  0   Term  0   Preterm  0   AB  0   Living  0     SAB  0   IAB  0   Ectopic  0   Multiple  0   Live Births  0           Family History  Problem Relation Age of Onset  . Diabetes Maternal Grandmother   . Heart disease Maternal Grandmother   . Heart disease Maternal Grandfather   . Breast cancer Paternal Grandmother        Age 86's  . Heart disease Paternal Grandfather   . Healthy Mother   . Myasthenia gravis Father        20+ years ago    Social History   Tobacco Use  . Smoking status: Never Smoker  . Smokeless tobacco: Never Used  Vaping Use  . Vaping Use: Never used  Substance Use Topics  . Alcohol use: Yes    Comment: SOCIAL   .  Drug use: Never    Home Medications Prior to Admission medications   Medication Sig Start Date End Date Taking? Authorizing Provider  amphetamine-dextroamphetamine (ADDERALL XR) 20 MG 24 hr capsule Take 1 capsule (20 mg total) by mouth every morning. 11/15/20 12/15/20  Nelwyn Salisbury, MD  norethindrone-ethinyl estradiol (JUNEL 1/20) 1-20 MG-MCG tablet Take 1 tablet by mouth daily. 07/03/19   Harrington Challenger, NP  traZODone (DESYREL) 50 MG tablet TAKE 2 TABLETS BY MOUTH AT BEDTIME. 08/29/20   Levert Feinstein, MD    Allergies    Codeine, Hydrocodone, Mushroom extract complex, and Penicillins  Review of Systems   Review of Systems  Constitutional: Negative for chills, diaphoresis, fatigue and fever.  HENT: Negative for congestion, rhinorrhea and sneezing.   Eyes: Negative.   Respiratory: Negative for cough, chest tightness and shortness of breath.   Cardiovascular: Negative for chest pain and leg swelling.  Gastrointestinal: Positive for abdominal pain and nausea. Negative for blood in stool, diarrhea and vomiting.  Genitourinary:  Negative for difficulty urinating, flank pain, frequency and hematuria.  Musculoskeletal: Negative for arthralgias and back pain.  Skin: Negative for rash.  Neurological: Negative for dizziness, speech difficulty, weakness, numbness and headaches.    Physical Exam Updated Vital Signs BP 116/76   Pulse 71   Temp 98.1 F (36.7 C) (Oral)   Resp 18   Ht 5\' 3"  (1.6 m)   Wt 80.7 kg   LMP 11/03/2020   SpO2 100%   BMI 31.53 kg/m   Physical Exam Constitutional:      Appearance: She is well-developed.  HENT:     Head: Normocephalic and atraumatic.  Eyes:     Pupils: Pupils are equal, round, and reactive to light.  Cardiovascular:     Rate and Rhythm: Normal rate and regular rhythm.     Heart sounds: Normal heart sounds.  Pulmonary:     Effort: Pulmonary effort is normal. No respiratory distress.     Breath sounds: Normal breath sounds. No wheezing or rales.   Chest:     Chest wall: No tenderness.  Abdominal:     General: Bowel sounds are normal.     Palpations: Abdomen is soft.     Tenderness: There is abdominal tenderness in the right upper quadrant and epigastric area. There is no guarding or rebound.  Musculoskeletal:        General: Normal range of motion.     Cervical back: Normal range of motion and neck supple.  Lymphadenopathy:     Cervical: No cervical adenopathy.  Skin:    General: Skin is warm and dry.     Findings: No rash.  Neurological:     Mental Status: She is alert and oriented to person, place, and time.     ED Results / Procedures / Treatments   Labs (all labs ordered are listed, but only abnormal results are displayed) Labs Reviewed  CBC WITH DIFFERENTIAL/PLATELET - Abnormal; Notable for the following components:      Result Value   WBC 11.0 (*)    All other components within normal limits  LIPASE, BLOOD  COMPREHENSIVE METABOLIC PANEL  URINALYSIS, ROUTINE W REFLEX MICROSCOPIC  PREGNANCY, URINE    EKG None  Radiology No results found.  Procedures Procedures   Medications Ordered in ED Medications - No data to display  ED Course  I have reviewed the triage vital signs and the nursing notes.  Pertinent labs & imaging results that were available during my care of the patient were reviewed by me and considered in my medical decision making (see chart for details).    MDM Rules/Calculators/A&P                          Patient is a 25 year old female who presents with right upper quadrant abdominal pain.  She is concerned about her gallbladder.  Her labs are nonconcerning.  Her white count is minimally elevated.  She is afebrile.  Her pain is well controlled.  Unfortunately we do not have ultrasound tech here tonight to do ultrasound.  Given that she does not have any clinical suggestions of cholecystitis or obstruction, I feel that she can have an outpatient ultrasound scheduled for tomorrow.  I have  ordered that.  She was given strict return precautions and instructed on her gallbladder diet.  21:20 we were able to get an ultrasound tech and.  Ultrasound was performed which shows no evidence of cholelithiasis or cholecystitis.  She was discharged  home in good condition.  She was started on Protonix.  She was encouraged to follow-up with her PCP. Final Clinical Impression(s) / ED Diagnoses Final diagnoses:  RUQ pain    Rx / DC Orders ED Discharge Orders         Ordered    US Abdomen Limited RUQ/Gall Bladder        12/25/20 2009           Rolan Bucco, MD 12/25/20 2013    Rolan Bucco, MD 12/25/20 2121

## 2020-12-25 NOTE — ED Triage Notes (Signed)
Pt reports having upper abd pain since march and lime green urine today. Nausea, but denies any diarrhea or vomiting.

## 2021-01-12 DIAGNOSIS — Z20822 Contact with and (suspected) exposure to covid-19: Secondary | ICD-10-CM | POA: Diagnosis not present

## 2021-01-12 DIAGNOSIS — J069 Acute upper respiratory infection, unspecified: Secondary | ICD-10-CM | POA: Diagnosis not present

## 2021-01-12 DIAGNOSIS — J029 Acute pharyngitis, unspecified: Secondary | ICD-10-CM | POA: Diagnosis not present

## 2021-01-18 ENCOUNTER — Telehealth: Payer: Self-pay | Admitting: Family Medicine

## 2021-01-18 NOTE — Telephone Encounter (Signed)
Last refill- 11/15/20--30 tabs no refill Last office visit- 06/01/2020  No future visit scheduled

## 2021-01-18 NOTE — Telephone Encounter (Signed)
Patient is calling and requesting a refill for amphetamine-dextroamphetamine (ADDERALL XR) 20 MG 24 hr capsule to be sent to   [id number] [place], [place] - [address]  [address], [place] [address]  Phone:  [phone] Fax:  [phone] CB is [phone]

## 2021-01-19 MED ORDER — AMPHETAMINE-DEXTROAMPHET ER 20 MG PO CP24: 20.0000 mg | ORAL_CAPSULE | ORAL | 0 refills | Status: DC

## 2021-01-19 MED ORDER — AMPHETAMINE-DEXTROAMPHET ER 20 MG PO CP24
20.0000 mg | ORAL_CAPSULE | ORAL | 0 refills | Status: DC
Start: 2021-03-21 — End: 2021-02-24

## 2021-01-19 NOTE — Telephone Encounter (Signed)
Done

## 2021-02-23 ENCOUNTER — Telehealth: Payer: Self-pay | Admitting: Family Medicine

## 2021-02-23 NOTE — Telephone Encounter (Signed)
Last office visit- 06/01/20 No future office visit scheduled

## 2021-02-23 NOTE — Telephone Encounter (Signed)
She already has refills until August 2

## 2021-02-23 NOTE — Telephone Encounter (Signed)
Patient is calling and requesting a medication refill for amphetamine-dextroamphetamine (ADDERALL XR) 20 MG 24 hr capsule to be sent   [id number] [place], [place] - [address]  [address], [place] [address]  Phone:  [phone] Fax:  [phone] CB is [phone]

## 2021-02-24 MED ORDER — AMPHETAMINE-DEXTROAMPHET ER 20 MG PO CP24: 20.0000 mg | ORAL_CAPSULE | ORAL | 0 refills | Status: DC

## 2021-02-24 NOTE — Addendum Note (Signed)
Addended by: Gershon Crane A on: 02/24/2021 12:57 PM   Modules accepted: Orders

## 2021-02-24 NOTE — Telephone Encounter (Signed)
Patient called back and stated that [facility] at [address] have the medication in stock and if it could be sent there, please advise. CB is [phone]

## 2021-02-24 NOTE — Telephone Encounter (Signed)
Spoke with pt aware to pick up Adderall from [facility]

## 2021-02-24 NOTE — Telephone Encounter (Signed)
Done for one month  ?

## 2021-02-24 NOTE — Telephone Encounter (Signed)
Please resubmit to [facility] [address].  See message below.

## 2021-02-24 NOTE — Telephone Encounter (Signed)
[facility], at this time [facility]derall XR is currently [facility].  Will call to update pharmacy as to where new prescription to go

## 2021-03-31 ENCOUNTER — Other Ambulatory Visit: Payer: Self-pay | Admitting: Family Medicine

## 2021-03-31 ENCOUNTER — Telehealth: Payer: Self-pay | Admitting: Family Medicine

## 2021-03-31 MED ORDER — AMPHETAMINE-DEXTROAMPHET ER 20 MG PO CP24: 20.0000 mg | ORAL_CAPSULE | ORAL | 0 refills | Status: DC

## 2021-03-31 NOTE — Telephone Encounter (Signed)
Done

## 2021-03-31 NOTE — Telephone Encounter (Signed)
Pt LOV was on 06/19/2020  Last refill was done on 02/24/2021  Please advise

## 2021-03-31 NOTE — Telephone Encounter (Signed)
Patient called to say that she needs a refill of amphetamine-dextroamphetamine (ADDERALL XR) 20 MG 24 hr capsule (Expired) to be [id number] [place],  - [address]  Please advise.

## 2021-04-01 NOTE — Telephone Encounter (Signed)
[facility] [address] [facility], patient picked up on yesterday

## 2021-05-12 ENCOUNTER — Telehealth (INDEPENDENT_AMBULATORY_CARE_PROVIDER_SITE_OTHER): Payer: BC Managed Care – PPO | Admitting: Family Medicine

## 2021-05-12 ENCOUNTER — Telehealth: Payer: Self-pay

## 2021-05-12 ENCOUNTER — Encounter: Payer: Self-pay | Admitting: Family Medicine

## 2021-05-12 VITALS — Ht 63.0 in

## 2021-05-12 DIAGNOSIS — U071 COVID-19: Secondary | ICD-10-CM | POA: Diagnosis not present

## 2021-05-12 NOTE — Progress Notes (Signed)
Virtual Visit via Video Note I connected with Ms. Tarmowsky on 05/12/21 by a video enabled telemedicine application and verified that I am speaking with the correct person using two identifiers.  Location patient: home Location provider:work office Persons participating in the virtual visit: patient, provider  I discussed the limitations of evaluation and management by telemedicine and the availability of in person appointments. The patient expressed understanding and agreed to proceed.  Chief Complaint  Patient presents with   Covid Positive   HPI: Ms. Strahan is a 25 yo female with hx of ADHD and keratosis pilaris complaining of 2 days of respiratory symptoms, Monday 05/10/21. She had a positive COVID-19 home test same day symptoms started. No known sick contact. Her fianc started symptoms around the same time.  She traveled recently to [place] and a stayed from 8/11 to 05/02/2021. Fever of 101.3 F, chills, body aches, fatigue, nasal congestion, rhinorrhea, sore throat, nonproductive cough, and heavy" chest sensation. Negative for history of asthma or tobacco use.  Negative for anosmia, ageusia, CP, dyspnea, wheezing, abdominal pain, N/V/D, changes in bowel habits, urinary symptoms, or skin rash. She has taking acetaminophen and nasal Afrin. COVID-19 vaccination completed. She had COVID 19 infection in 09/2018.  ROS: See pertinent positives and negatives per HPI.  Past Medical History:  Diagnosis Date   ADHD    Dysarthria    after having Covid-19 in January 2020   Past Surgical History:  Procedure Laterality Date   MOUTH SURGERY      Family History  Problem Relation Age of Onset   Diabetes Maternal Grandmother    Heart disease Maternal Grandmother    Heart disease Maternal Grandfather    Breast cancer Paternal Grandmother        Age 70's   Heart disease Paternal Grandfather    Healthy Mother    Myasthenia gravis Father        20+ years ago    Social History    Socioeconomic History   Marital status: Single    Spouse name: Not on file   Number of children: 0   Years of education: 2-3 years of college   Highest education level: Not on file  Occupational History   Occupation: Theme park manager  Tobacco Use   Smoking status: Never   Smokeless tobacco: Never  Vaping Use   Vaping Use: Never used  Substance and Sexual Activity   Alcohol use: Yes    Comment: SOCIAL    Drug use: Never   Sexual activity: Yes    Birth control/protection: Condom, Pill    Comment: DECLINED INSURANCE QUESTIONS  Other Topics Concern   Not on file  Social History Narrative   Lives at home with parents.   Right-handed.   Caffeine use: 2 cups per day.   Social Determinants of Health   Financial Resource Strain: Not on file  Food Insecurity: Not on file  Transportation Needs: Not on file  Physical Activity: Not on file  Stress: Not on file  Social Connections: Not on file  Intimate Partner Violence: Not on file    Current Outpatient Medications:    [START ON 06/01/2021] amphetamine-dextroamphetamine (ADDERALL XR) 20 MG 24 hr capsule, Take 1 capsule (20 mg total) by mouth every morning., Disp: 30 capsule, Rfl: 0   norethindrone-ethinyl estradiol (JUNEL 1/20) 1-20 MG-MCG tablet, Take 1 tablet by mouth daily., Disp: 3 Package, Rfl: 4   pantoprazole (PROTONIX) 20 MG tablet, Take 1 tablet (20 mg total) by mouth daily., Disp: 30 tablet,  Rfl: 0   traZODone (DESYREL) 50 MG tablet, TAKE 2 TABLETS BY MOUTH AT BEDTIME., Disp: 180 tablet, Rfl: 1  EXAM:  VITALS per patient if applicable:Ht 5\' 3"  (1.6 m)   BMI 31.53 kg/m   GENERAL: alert, oriented, appears well and in no acute distress  HEENT: atraumatic, conjunctiva clear, no obvious abnormalities on inspection of external nose and ears  NECK: normal movements of the head and neck  LUNGS: on inspection no signs of respiratory distress, breathing rate appears normal, no obvious gross SOB, gasping or  wheezing Non productive cough a few times during visit.  CV: no obvious cyanosis  MS: moves all visible extremities without noticeable abnormality  PSYCH/NEURO: pleasant and cooperative, no obvious depression or anxiety, upset. ASSESSMENT AND PLAN:  Discussed the following assessment and plan:  COVID-19 virus infection  We discussed Dx,possible complications and treatment options. She has a mild to moderate case with low risk for complications. Explained that it would be appropriate to continue symptomatic treatment, which I recommend; but antiviral medication is also an option. We discussed oral antiviral options (Paxlovid and molnupiravir) and side effects, including medication interactions. Also recommend Flonase nasal spray to help with nasal congestion and benzonatate to help with cough as well as plenty of p.o. fluids Advised to be cautious with nasal Afrin, educated about possible side effects.  Called patient because she was breaking up during video visit. Very upset when I asked her if she was interested in antiviral medication and more so when I asked about Asked for LMP and if she was taking OCPs, which can interact with Paxlovid. She did not answer neither question and continue with the following: "I do not know, it is why I am calling the fucking doctor" "it does not seem like you know what you are doing" " I am expecting not to be charge for this visit" and did hung up. Prescriptions were not sent.  We discussed possible serious and likely etiologies, options for evaluation and workup, limitations of telemedicine visit vs in person visit, treatment, treatment risks and precautions.  I discussed the assessment and treatment plan with the patient. The patient was provided an opportunity to ask questions and all were answered.   Return if symptoms worsen or fail to improve.  Ceazia Harb , MD

## 2021-05-12 NOTE — Telephone Encounter (Signed)
Patient is calling in stating that she just finished a virtual with Dr.Jordan. Lisa Duncan is not very pleased with her visit, she said she was advised that she shouldn't do the anti viral medication due to being a low risk as well as it is not usually prescribed for her age range. Lisa Duncan said she then asked about a nasal spray or cough medication to help with congestion to which she was asked "well what do you want to do?". Patient was very furious with this response and snapped at her as Lisa Duncan states that she isnt a doctor and doesn't really know what her options are. She said Dr.Jordan then said well you can start taking these tablets, when Lisa Duncan asked what the medication was Dr.Jordan told her it was the antiviral medication. Patient isnt satisfied and would like someone from administration to call her back.

## 2021-06-14 DIAGNOSIS — M542 Cervicalgia: Secondary | ICD-10-CM | POA: Diagnosis not present

## 2021-06-14 DIAGNOSIS — M9905 Segmental and somatic dysfunction of pelvic region: Secondary | ICD-10-CM | POA: Diagnosis not present

## 2021-06-14 DIAGNOSIS — M21752 Unequal limb length (acquired), left femur: Secondary | ICD-10-CM | POA: Diagnosis not present

## 2021-06-14 DIAGNOSIS — M9901 Segmental and somatic dysfunction of cervical region: Secondary | ICD-10-CM | POA: Diagnosis not present

## 2021-06-14 DIAGNOSIS — M9903 Segmental and somatic dysfunction of lumbar region: Secondary | ICD-10-CM | POA: Diagnosis not present

## 2021-06-14 DIAGNOSIS — M9902 Segmental and somatic dysfunction of thoracic region: Secondary | ICD-10-CM | POA: Diagnosis not present

## 2021-06-16 DIAGNOSIS — M9901 Segmental and somatic dysfunction of cervical region: Secondary | ICD-10-CM | POA: Diagnosis not present

## 2021-06-16 DIAGNOSIS — M9905 Segmental and somatic dysfunction of pelvic region: Secondary | ICD-10-CM | POA: Diagnosis not present

## 2021-06-16 DIAGNOSIS — M9902 Segmental and somatic dysfunction of thoracic region: Secondary | ICD-10-CM | POA: Diagnosis not present

## 2021-06-16 DIAGNOSIS — M9903 Segmental and somatic dysfunction of lumbar region: Secondary | ICD-10-CM | POA: Diagnosis not present

## 2021-06-18 DIAGNOSIS — M9905 Segmental and somatic dysfunction of pelvic region: Secondary | ICD-10-CM | POA: Diagnosis not present

## 2021-06-18 DIAGNOSIS — M9902 Segmental and somatic dysfunction of thoracic region: Secondary | ICD-10-CM | POA: Diagnosis not present

## 2021-06-18 DIAGNOSIS — M9901 Segmental and somatic dysfunction of cervical region: Secondary | ICD-10-CM | POA: Diagnosis not present

## 2021-06-18 DIAGNOSIS — M9903 Segmental and somatic dysfunction of lumbar region: Secondary | ICD-10-CM | POA: Diagnosis not present

## 2021-06-21 DIAGNOSIS — M9902 Segmental and somatic dysfunction of thoracic region: Secondary | ICD-10-CM | POA: Diagnosis not present

## 2021-06-21 DIAGNOSIS — M9903 Segmental and somatic dysfunction of lumbar region: Secondary | ICD-10-CM | POA: Diagnosis not present

## 2021-06-21 DIAGNOSIS — M9901 Segmental and somatic dysfunction of cervical region: Secondary | ICD-10-CM | POA: Diagnosis not present

## 2021-06-21 DIAGNOSIS — M9905 Segmental and somatic dysfunction of pelvic region: Secondary | ICD-10-CM | POA: Diagnosis not present

## 2021-06-23 DIAGNOSIS — M9903 Segmental and somatic dysfunction of lumbar region: Secondary | ICD-10-CM | POA: Diagnosis not present

## 2021-06-23 DIAGNOSIS — M9901 Segmental and somatic dysfunction of cervical region: Secondary | ICD-10-CM | POA: Diagnosis not present

## 2021-06-23 DIAGNOSIS — M9905 Segmental and somatic dysfunction of pelvic region: Secondary | ICD-10-CM | POA: Diagnosis not present

## 2021-06-23 DIAGNOSIS — M9902 Segmental and somatic dysfunction of thoracic region: Secondary | ICD-10-CM | POA: Diagnosis not present

## 2021-06-28 DIAGNOSIS — M9905 Segmental and somatic dysfunction of pelvic region: Secondary | ICD-10-CM | POA: Diagnosis not present

## 2021-06-28 DIAGNOSIS — M9902 Segmental and somatic dysfunction of thoracic region: Secondary | ICD-10-CM | POA: Diagnosis not present

## 2021-06-28 DIAGNOSIS — M9903 Segmental and somatic dysfunction of lumbar region: Secondary | ICD-10-CM | POA: Diagnosis not present

## 2021-06-28 DIAGNOSIS — M9901 Segmental and somatic dysfunction of cervical region: Secondary | ICD-10-CM | POA: Diagnosis not present

## 2021-06-30 DIAGNOSIS — M9901 Segmental and somatic dysfunction of cervical region: Secondary | ICD-10-CM | POA: Diagnosis not present

## 2021-06-30 DIAGNOSIS — M9905 Segmental and somatic dysfunction of pelvic region: Secondary | ICD-10-CM | POA: Diagnosis not present

## 2021-06-30 DIAGNOSIS — M9902 Segmental and somatic dysfunction of thoracic region: Secondary | ICD-10-CM | POA: Diagnosis not present

## 2021-06-30 DIAGNOSIS — M9903 Segmental and somatic dysfunction of lumbar region: Secondary | ICD-10-CM | POA: Diagnosis not present

## 2021-07-02 DIAGNOSIS — M9901 Segmental and somatic dysfunction of cervical region: Secondary | ICD-10-CM | POA: Diagnosis not present

## 2021-07-02 DIAGNOSIS — M9905 Segmental and somatic dysfunction of pelvic region: Secondary | ICD-10-CM | POA: Diagnosis not present

## 2021-07-02 DIAGNOSIS — M9902 Segmental and somatic dysfunction of thoracic region: Secondary | ICD-10-CM | POA: Diagnosis not present

## 2021-07-02 DIAGNOSIS — M9903 Segmental and somatic dysfunction of lumbar region: Secondary | ICD-10-CM | POA: Diagnosis not present

## 2021-07-09 DIAGNOSIS — M9905 Segmental and somatic dysfunction of pelvic region: Secondary | ICD-10-CM | POA: Diagnosis not present

## 2021-07-09 DIAGNOSIS — M9903 Segmental and somatic dysfunction of lumbar region: Secondary | ICD-10-CM | POA: Diagnosis not present

## 2021-07-09 DIAGNOSIS — M9902 Segmental and somatic dysfunction of thoracic region: Secondary | ICD-10-CM | POA: Diagnosis not present

## 2021-07-09 DIAGNOSIS — M9901 Segmental and somatic dysfunction of cervical region: Secondary | ICD-10-CM | POA: Diagnosis not present

## 2021-07-13 DIAGNOSIS — M9902 Segmental and somatic dysfunction of thoracic region: Secondary | ICD-10-CM | POA: Diagnosis not present

## 2021-07-13 DIAGNOSIS — M9905 Segmental and somatic dysfunction of pelvic region: Secondary | ICD-10-CM | POA: Diagnosis not present

## 2021-07-13 DIAGNOSIS — M9903 Segmental and somatic dysfunction of lumbar region: Secondary | ICD-10-CM | POA: Diagnosis not present

## 2021-07-16 DIAGNOSIS — M9902 Segmental and somatic dysfunction of thoracic region: Secondary | ICD-10-CM | POA: Diagnosis not present

## 2021-07-16 DIAGNOSIS — M9903 Segmental and somatic dysfunction of lumbar region: Secondary | ICD-10-CM | POA: Diagnosis not present

## 2021-07-16 DIAGNOSIS — M9905 Segmental and somatic dysfunction of pelvic region: Secondary | ICD-10-CM | POA: Diagnosis not present

## 2021-07-21 ENCOUNTER — Ambulatory Visit: Payer: BC Managed Care – PPO | Admitting: Nurse Practitioner

## 2021-07-23 DIAGNOSIS — M9905 Segmental and somatic dysfunction of pelvic region: Secondary | ICD-10-CM | POA: Diagnosis not present

## 2021-07-23 DIAGNOSIS — M9903 Segmental and somatic dysfunction of lumbar region: Secondary | ICD-10-CM | POA: Diagnosis not present

## 2021-07-23 DIAGNOSIS — M9902 Segmental and somatic dysfunction of thoracic region: Secondary | ICD-10-CM | POA: Diagnosis not present

## 2021-07-26 ENCOUNTER — Other Ambulatory Visit: Payer: Self-pay

## 2021-07-26 ENCOUNTER — Ambulatory Visit: Payer: BC Managed Care – PPO | Admitting: Nurse Practitioner

## 2021-07-26 ENCOUNTER — Encounter: Payer: Self-pay | Admitting: Nurse Practitioner

## 2021-07-26 VITALS — BP 118/80 | HR 103 | Temp 98.2°F | Ht 64.0 in | Wt 183.2 lb

## 2021-07-26 DIAGNOSIS — Z0000 Encounter for general adult medical examination without abnormal findings: Secondary | ICD-10-CM | POA: Diagnosis not present

## 2021-07-26 DIAGNOSIS — E6609 Other obesity due to excess calories: Secondary | ICD-10-CM | POA: Diagnosis not present

## 2021-07-26 DIAGNOSIS — F900 Attention-deficit hyperactivity disorder, predominantly inattentive type: Secondary | ICD-10-CM

## 2021-07-26 DIAGNOSIS — Z6831 Body mass index (BMI) 31.0-31.9, adult: Secondary | ICD-10-CM | POA: Diagnosis not present

## 2021-07-26 DIAGNOSIS — Z7689 Persons encountering health services in other specified circumstances: Secondary | ICD-10-CM

## 2021-07-26 MED ORDER — AMPHETAMINE-DEXTROAMPHET ER 20 MG PO CP24: 20.0000 mg | ORAL_CAPSULE | ORAL | 0 refills | Status: DC

## 2021-07-26 NOTE — Progress Notes (Signed)
I,Yamilka J Llittleton,acting as a Education administrator for [employer], FNP.,have documented all relevant documentation on the behalf of Minette Brine, FNP,as directed by  Minette Brine, FNP while in the presence of Minette Brine, [place].   This visit occurred during the SARS-CoV-2 public health emergency.  Safety protocols were in place, including screening questions prior to the visit, additional usage of staff PPE, and extensive cleaning of exam room while observing appropriate contact time as indicated for disinfecting solutions.  Subjective:     Patient ID: Lisa Duncan , female    DOB: [birth year] , 25 y.o.   MRN: [id number]   Chief Complaint  Patient presents with   Establish Care   Annual Exam    HPI  Patient presents today to establish primary care. She was going to [facility] - Dr. Sharlene Motts - she had covid August 2022.  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 She has a fiance getting married next year. She does not have any children.  She has had covid twice first in 2020 - thinks it was incubating around the time she had the covid vaccine. She did not take any medication in August for covid.  She has a GYN - [facility]. She has had ADHD since childhood - January 2020 she restarted due to having difficulty with focus.  She had been taking trazadone for sleep and anxiety but was having a bad effect on her, was groggy as well.   She would like to have a physical done.  Wt Readings from Last 3 Encounters: 07/26/21 : 183 lb 3.2 oz (83.1 kg)       Past Medical History:  Diagnosis Date   ADHD    Dysarthria    after having Covid-19 in January 2020     Family History  Problem Relation Age of Onset   Diabetes Maternal Grandmother    Heart disease Maternal Grandmother    Heart disease Maternal Grandfather    Breast cancer Paternal Grandmother        Age 92's   Heart disease Paternal Grandfather     Healthy Mother    Myasthenia gravis Father        20+ years ago     Current Outpatient Medications:    norethindrone-ethinyl estradiol (JUNEL 1/20) 1-20 MG-MCG tablet, Take 1 tablet by mouth daily., Disp: 3 Package, Rfl: 4   amphetamine-dextroamphetamine (ADDERALL XR) 20 MG 24 hr capsule, Take 1 capsule (20 mg total) by mouth every morning., Disp: 30 capsule, Rfl: 0   Allergies  Allergen Reactions   Codeine Nausea And Vomiting   Hydrocodone     Altered mental status, extreme anxiety   Mushroom Extract Complex Other (See Comments)    Mouth soreness.   Penicillins Nausea And Vomiting and Rash      The patient states she uses OCP (estrogen/progesterone) for birth control. Last LMP was Patient's last menstrual period was 06/28/2021.. Negative for Dysmenorrhea and Negative for Menorrhagia. Negative for: breast discharge, breast lump(s), breast pain and breast self exam. Associated symptoms include abnormal vaginal bleeding. Pertinent negatives include abnormal bleeding (hematology), anxiety, decreased libido, depression, difficulty falling sleep, dyspareunia, history of infertility, nocturia, sexual dysfunction, sleep disturbances, urinary incontinence, urinary urgency, vaginal discharge and vaginal itching. Diet regular.  The patient states her exercise level is moderate - 4-5 times a week.   The patient's tobacco use is:  Social History  Tobacco Use  Smoking Status Never  Smokeless Tobacco Never   She has been exposed to passive smoke. The patient's alcohol use is:  Social History   Substance and Sexual Activity  Alcohol Use Yes   Comment: SOCIAL    Additional information: Last pap reports this year will need to get records.    Review of Systems  Constitutional: Negative.   HENT: Negative.    Eyes: Negative.   Respiratory: Negative.    Cardiovascular: Negative.   Gastrointestinal: Negative.   Endocrine: Negative.   Genitourinary: Negative.   Musculoskeletal: Negative.    Skin: Negative.   Allergic/Immunologic: Negative.   Neurological: Negative.   Hematological: Negative.   Psychiatric/Behavioral: Negative.      Today's Vitals   07/26/21 1438  BP: 118/80  Pulse: (!) 103  Temp: 98.2 F (36.8 C)  Weight: 183 lb 3.2 oz (83.1 kg)  Height: 5' 4"  (1.626 m)  PainSc: 0-No pain   Body mass index is 31.45 kg/m.   Objective:  Physical Exam Constitutional:      General: She is not in acute distress.    Appearance: Normal appearance. She is well-developed. She is obese.  HENT:     Head: Normocephalic and atraumatic.     Right Ear: Hearing, tympanic membrane, ear canal and external ear normal. There is no impacted cerumen.     Left Ear: Hearing, tympanic membrane, ear canal and external ear normal. There is no impacted cerumen.     Nose:     Comments: Deferred - masked    Mouth/Throat:     Comments: Deferred - masked Eyes:     General: Lids are normal.     Extraocular Movements: Extraocular movements intact.     Conjunctiva/sclera: Conjunctivae normal.     Pupils: Pupils are equal, round, and reactive to light.     Funduscopic exam:    Right eye: No papilledema.        Left eye: No papilledema.  Neck:     Thyroid: No thyroid mass.     Vascular: No carotid bruit.  Cardiovascular:     Rate and Rhythm: Normal rate and regular rhythm.     Pulses: Normal pulses.     Heart sounds: Normal heart sounds. No murmur heard. Pulmonary:     Effort: Pulmonary effort is normal.     Breath sounds: Normal breath sounds.  Chest:     Chest wall: No mass.  Breasts:    Tanner Score is 5.     Right: Normal. No mass or tenderness.     Left: Normal. No mass or tenderness.  Abdominal:     General: Abdomen is flat. Bowel sounds are normal. There is no distension.     Palpations: Abdomen is soft.     Tenderness: There is no abdominal tenderness.  Genitourinary:    Rectum: Guaiac result negative.  Musculoskeletal:        General: No swelling. Normal range of  motion.     Cervical back: Full passive range of motion without pain, normal range of motion and neck supple.     Right lower leg: No edema.     Left lower leg: No edema.  Lymphadenopathy:     Upper Body:     Right upper body: No supraclavicular, axillary or pectoral adenopathy.     Left upper body: No supraclavicular, axillary or pectoral adenopathy.  Skin:    General: Skin is warm and dry.     Capillary Refill: Capillary refill  takes less than 2 seconds.  Neurological:     General: No focal deficit present.     Mental Status: She is alert and oriented to person, place, and time.     Cranial Nerves: No cranial nerve deficit.     Sensory: No sensory deficit.     Motor: No weakness.  Psychiatric:        Mood and Affect: Mood normal.        Behavior: Behavior normal.        Thought Content: Thought content normal.        Judgment: Judgment normal.        Assessment And Plan:     1. Encounter for general adult medical examination w/o abnormal findings Behavior modifications discussed and diet history reviewed.   Pt will continue to exercise regularly and modify diet with low GI, plant based foods and decrease intake of processed foods.  Recommend intake of daily multivitamin, Vitamin D, and calcium.  Recommend for preventive screenings, as well as recommend immunizations that include influenza, TDAP - CBC - CMP14+EGFR - Lipid panel - Hemoglobin A1c  2. Class 1 obesity due to excess calories with body mass index (BMI) of 31.0 to 31.9 in adult, unspecified whether serious comorbidity present Chronic Discussed healthy diet and regular exercise options  Encouraged to exercise at least 150 minutes per week with 2 days of strength training  3. ADHD (attention deficit hyperactivity disorder), inattentive type Continue follow up with behavioral health  4. Establishing care with new doctor, encounter for   Patient was given opportunity to ask questions. Patient verbalized  understanding of the plan and was able to repeat key elements of the plan. All questions were answered to their satisfaction.   Minette Brine, FNP   I, Minette Brine, FNP, have reviewed all documentation for this visit. The documentation on 07/26/21 for the exam, diagnosis, procedures, and orders are all accurate and complete.   THE PATIENT IS ENCOURAGED TO PRACTICE SOCIAL DISTANCING DUE TO THE COVID-19 PANDEMIC.

## 2021-07-26 NOTE — Patient Instructions (Signed)

## 2021-07-27 DIAGNOSIS — M9902 Segmental and somatic dysfunction of thoracic region: Secondary | ICD-10-CM | POA: Diagnosis not present

## 2021-07-27 DIAGNOSIS — M9903 Segmental and somatic dysfunction of lumbar region: Secondary | ICD-10-CM | POA: Diagnosis not present

## 2021-07-27 DIAGNOSIS — M9905 Segmental and somatic dysfunction of pelvic region: Secondary | ICD-10-CM | POA: Diagnosis not present

## 2021-07-27 LAB — HEMOGLOBIN A1C
Est. average glucose Bld gHb Est-mCnc: 94 mg/dL
Hgb A1c MFr Bld: 4.9 % (ref 4.8–5.6)

## 2021-07-27 LAB — CMP14+EGFR
ALT: 57 IU/L — ABNORMAL HIGH (ref 0–32)
AST: 40 IU/L (ref 0–40)
Albumin/Globulin Ratio: 2.2 (ref 1.2–2.2)
Albumin: 4.8 g/dL (ref 3.9–5.0)
Alkaline Phosphatase: 133 IU/L — ABNORMAL HIGH (ref 44–121)
BUN/Creatinine Ratio: 12 (ref 9–23)
BUN: 11 mg/dL (ref 6–20)
Bilirubin Total: 1 mg/dL (ref 0.0–1.2)
CO2: 22 mmol/L (ref 20–29)
Calcium: 9.2 mg/dL (ref 8.7–10.2)
Chloride: 104 mmol/L (ref 96–106)
Creatinine, Ser: 0.9 mg/dL (ref 0.57–1.00)
Globulin, Total: 2.2 g/dL (ref 1.5–4.5)
Glucose: 90 mg/dL (ref 70–99)
Potassium: 4.2 mmol/L (ref 3.5–5.2)
Sodium: 140 mmol/L (ref 134–144)
Total Protein: 7 g/dL (ref 6.0–8.5)
eGFR: 92 mL/min/{1.73_m2} (ref >59)

## 2021-07-27 LAB — LIPID PANEL
Chol/HDL Ratio: 3.7 ratio (ref 0.0–4.4)
Cholesterol, Total: 195 mg/dL (ref 100–199)
HDL: 53 mg/dL (ref >39)
LDL Chol Calc (NIH): 120 mg/dL — ABNORMAL HIGH (ref 0–99)
Triglycerides: 121 mg/dL (ref 0–149)
VLDL Cholesterol Cal: 22 mg/dL (ref 5–40)

## 2021-07-27 LAB — CBC
Hematocrit: 37.3 % (ref 34.0–46.6)
Hemoglobin: 12.9 g/dL (ref 11.1–15.9)
MCH: 28.8 pg (ref 26.6–33.0)
MCHC: 34.6 g/dL (ref 31.5–35.7)
MCV: 83 fL (ref 79–97)
Platelets: 349 10*3/uL (ref 150–450)
RBC: 4.48 x10E6/uL (ref 3.77–5.28)
RDW: 12 % (ref 11.7–15.4)
WBC: 8.2 10*3/uL (ref 3.4–10.8)

## 2021-08-02 DIAGNOSIS — M9905 Segmental and somatic dysfunction of pelvic region: Secondary | ICD-10-CM | POA: Diagnosis not present

## 2021-08-02 DIAGNOSIS — M9902 Segmental and somatic dysfunction of thoracic region: Secondary | ICD-10-CM | POA: Diagnosis not present

## 2021-08-02 DIAGNOSIS — M9903 Segmental and somatic dysfunction of lumbar region: Secondary | ICD-10-CM | POA: Diagnosis not present

## 2021-08-09 DIAGNOSIS — M9903 Segmental and somatic dysfunction of lumbar region: Secondary | ICD-10-CM | POA: Diagnosis not present

## 2021-08-09 DIAGNOSIS — M9902 Segmental and somatic dysfunction of thoracic region: Secondary | ICD-10-CM | POA: Diagnosis not present

## 2021-08-09 DIAGNOSIS — M9905 Segmental and somatic dysfunction of pelvic region: Secondary | ICD-10-CM | POA: Diagnosis not present

## 2021-08-16 DIAGNOSIS — M9902 Segmental and somatic dysfunction of thoracic region: Secondary | ICD-10-CM | POA: Diagnosis not present

## 2021-08-16 DIAGNOSIS — M9903 Segmental and somatic dysfunction of lumbar region: Secondary | ICD-10-CM | POA: Diagnosis not present

## 2021-08-16 DIAGNOSIS — M9905 Segmental and somatic dysfunction of pelvic region: Secondary | ICD-10-CM | POA: Diagnosis not present

## 2021-08-17 ENCOUNTER — Encounter: Payer: Self-pay | Admitting: Nurse Practitioner

## 2021-08-23 DIAGNOSIS — M9905 Segmental and somatic dysfunction of pelvic region: Secondary | ICD-10-CM | POA: Diagnosis not present

## 2021-08-23 DIAGNOSIS — M9903 Segmental and somatic dysfunction of lumbar region: Secondary | ICD-10-CM | POA: Diagnosis not present

## 2021-08-23 DIAGNOSIS — M9902 Segmental and somatic dysfunction of thoracic region: Secondary | ICD-10-CM | POA: Diagnosis not present

## 2021-08-30 ENCOUNTER — Encounter: Payer: Self-pay | Admitting: Nurse Practitioner

## 2021-09-15 DIAGNOSIS — F909 Attention-deficit hyperactivity disorder, unspecified type: Secondary | ICD-10-CM | POA: Diagnosis not present

## 2021-09-15 DIAGNOSIS — R945 Abnormal results of liver function studies: Secondary | ICD-10-CM | POA: Diagnosis not present

## 2021-09-15 DIAGNOSIS — N911 Secondary amenorrhea: Secondary | ICD-10-CM | POA: Diagnosis not present

## 2021-09-22 DIAGNOSIS — Z3481 Encounter for supervision of other normal pregnancy, first trimester: Secondary | ICD-10-CM | POA: Diagnosis not present

## 2021-09-22 DIAGNOSIS — Z31430 Encounter of female for testing for genetic disease carrier status for procreative management: Secondary | ICD-10-CM | POA: Diagnosis not present

## 2021-09-22 DIAGNOSIS — Z3685 Encounter for antenatal screening for Streptococcus B: Secondary | ICD-10-CM | POA: Diagnosis not present

## 2021-09-22 LAB — OB RESULTS CONSOLE GC/CHLAMYDIA
Chlamydia: NEGATIVE
Neisseria Gonorrhea: NEGATIVE

## 2021-09-22 LAB — OB RESULTS CONSOLE HIV ANTIBODY (ROUTINE TESTING): HIV: NONREACTIVE

## 2021-09-22 LAB — OB RESULTS CONSOLE HEPATITIS B SURFACE ANTIGEN: Hepatitis B Surface Ag: NEGATIVE

## 2021-09-22 LAB — OB RESULTS CONSOLE ABO/RH: RH Type: POSITIVE

## 2021-09-22 LAB — OB RESULTS CONSOLE RUBELLA ANTIBODY, IGM: Rubella: IMMUNE

## 2021-09-22 LAB — OB RESULTS CONSOLE RPR: RPR: NONREACTIVE

## 2021-10-05 DIAGNOSIS — Z3A10 10 weeks gestation of pregnancy: Secondary | ICD-10-CM | POA: Diagnosis not present

## 2021-10-05 DIAGNOSIS — Z113 Encounter for screening for infections with a predominantly sexual mode of transmission: Secondary | ICD-10-CM | POA: Diagnosis not present

## 2021-10-05 DIAGNOSIS — Z3481 Encounter for supervision of other normal pregnancy, first trimester: Secondary | ICD-10-CM | POA: Diagnosis not present

## 2021-10-20 DIAGNOSIS — Z3481 Encounter for supervision of other normal pregnancy, first trimester: Secondary | ICD-10-CM | POA: Diagnosis not present

## 2021-10-20 DIAGNOSIS — Z3A12 12 weeks gestation of pregnancy: Secondary | ICD-10-CM | POA: Diagnosis not present

## 2021-10-20 DIAGNOSIS — Z3682 Encounter for antenatal screening for nuchal translucency: Secondary | ICD-10-CM | POA: Diagnosis not present

## 2021-11-01 ENCOUNTER — Ambulatory Visit: Payer: BC Managed Care – PPO | Admitting: Nurse Practitioner

## 2021-11-17 ENCOUNTER — Other Ambulatory Visit: Payer: Self-pay

## 2021-11-17 ENCOUNTER — Encounter (HOSPITAL_COMMUNITY): Payer: Self-pay

## 2021-11-17 ENCOUNTER — Inpatient Hospital Stay (HOSPITAL_COMMUNITY)
Admission: AD | Admit: 2021-11-17 | Discharge: 2021-11-17 | Disposition: A | Discharge status: Home / Self Care | Payer: BC Managed Care – PPO | Attending: Obstetrics and Gynecology | Admitting: Obstetrics and Gynecology

## 2021-11-17 DIAGNOSIS — O99612 Diseases of the digestive system complicating pregnancy, second trimester: Secondary | ICD-10-CM | POA: Insufficient documentation

## 2021-11-17 DIAGNOSIS — E860 Dehydration: Secondary | ICD-10-CM

## 2021-11-17 DIAGNOSIS — O210 Mild hyperemesis gravidarum: Secondary | ICD-10-CM | POA: Insufficient documentation

## 2021-11-17 DIAGNOSIS — A084 Viral intestinal infection, unspecified: Secondary | ICD-10-CM

## 2021-11-17 DIAGNOSIS — Z3A16 16 weeks gestation of pregnancy: Secondary | ICD-10-CM | POA: Diagnosis not present

## 2021-11-17 DIAGNOSIS — J029 Acute pharyngitis, unspecified: Secondary | ICD-10-CM | POA: Diagnosis not present

## 2021-11-17 DIAGNOSIS — Z20822 Contact with and (suspected) exposure to covid-19: Secondary | ICD-10-CM | POA: Diagnosis not present

## 2021-11-17 HISTORY — DX: Anxiety disorder, unspecified: F41.9

## 2021-11-17 HISTORY — DX: Headache, unspecified: R51.9

## 2021-11-17 LAB — URINALYSIS, ROUTINE W REFLEX MICROSCOPIC
Bilirubin Urine: NEGATIVE
Glucose, UA: NEGATIVE mg/dL
Hgb urine dipstick: NEGATIVE
Ketones, ur: 5 mg/dL — AB
Nitrite: NEGATIVE
Protein, ur: 30 mg/dL — AB
Specific Gravity, Urine: 1.024 (ref 1.005–1.030)
pH: 5 (ref 5.0–8.0)

## 2021-11-17 LAB — BASIC METABOLIC PANEL
Anion gap: 9 (ref 5–15)
BUN: 6 mg/dL (ref 6–20)
CO2: 21 mmol/L — ABNORMAL LOW (ref 22–32)
Calcium: 9.2 mg/dL (ref 8.9–10.3)
Chloride: 105 mmol/L (ref 98–111)
Creatinine, Ser: 0.61 mg/dL (ref 0.44–1.00)
GFR, Estimated: >60 mL/min (ref >60)
Glucose, Bld: 88 mg/dL (ref 70–99)
Potassium: 3.9 mmol/L (ref 3.5–5.1)
Sodium: 135 mmol/L (ref 135–145)

## 2021-11-17 LAB — RESP PANEL BY RT-PCR (FLU A&B, COVID) ARPGX2
Influenza A by PCR: NEGATIVE
Influenza B by PCR: NEGATIVE
SARS Coronavirus 2 by RT PCR: NEGATIVE

## 2021-11-17 LAB — CBC
HCT: 35.3 % — ABNORMAL LOW (ref 36.0–46.0)
Hemoglobin: 12.2 g/dL (ref 12.0–15.0)
MCH: 29.1 pg (ref 26.0–34.0)
MCHC: 34.6 g/dL (ref 30.0–36.0)
MCV: 84.2 fL (ref 80.0–100.0)
Platelets: 257 10*3/uL (ref 150–400)
RBC: 4.19 MIL/uL (ref 3.87–5.11)
RDW: 12.8 % (ref 11.5–15.5)
WBC: 11.2 10*3/uL — ABNORMAL HIGH (ref 4.0–10.5)
nRBC: 0 % (ref 0.0–0.2)

## 2021-11-17 MED ORDER — LACTATED RINGERS IV BOLUS
1000.0000 mL | Freq: Once | INTRAVENOUS | Status: AC
  Administered 2021-11-17: 1000 mL via INTRAVENOUS

## 2021-11-17 MED ORDER — PROCHLORPERAZINE EDISYLATE 10 MG/2ML IJ SOLN
10.0000 mg | Freq: Once | INTRAMUSCULAR | Status: AC
  Administered 2021-11-17: 10 mg via INTRAVENOUS
  Filled 2021-11-17: qty 2

## 2021-11-17 MED ORDER — PROCHLORPERAZINE MALEATE 10 MG PO TABS: 10.0000 mg | ORAL_TABLET | Freq: Three times a day (TID) | ORAL | 0 refills | Status: DC | PRN

## 2021-11-17 MED ORDER — ONDANSETRON HCL 4 MG/2ML IJ SOLN
4.0000 mg | Freq: Once | INTRAMUSCULAR | Status: AC
  Administered 2021-11-17: 4 mg via INTRAVENOUS
  Filled 2021-11-17: qty 2

## 2021-11-17 NOTE — MAU Note (Addendum)
Lisa Duncan is a 26 y.o. at [redacted]w[redacted]d here in MAU reporting: started with a sore throat over the weekend and then started vomiting on Monday. Reports 11 episodes of emesis in the past 24 hours. No diarrhea. Does not have any antiemetics. States she did a covid test and it was negative. ? ?Onset of complaint: the weekend ? ?Pain score: 5/10 ? ?Vitals:  ? 11/17/21 1004  ?BP: 118/70  ?Pulse: (!) 101  ?Resp: 16  ?Temp: 98 ?F (36.7 ?C)  ?SpO2: 98%  ?   ?FHT:154 ? ?Lab orders placed from triage: UA ? ?

## 2021-11-17 NOTE — MAU Provider Note (Signed)
?History  ?  ? ?CSN: [id number] ? ?Arrival date and time: 11/17/21 0945 ? ? Event Date/Time  ? First Provider Initiated Contact with Patient 11/17/21 1028   ?  ? ?Chief Complaint  ?Patient presents with  ? Nausea  ? Sore Throat  ? ?26 y.o. G1 @16 .1 wks presenting with sore throat and N/V. Sore throat and body aches started 3 days ago. N/V started 2 days ago. She hasn't been able to tolerate anything po since yesterday. Endorses occasional cough with clear sputum. Reports feeling hot and cold at night. 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 Denies abdominal pain and VB. Took Covid test which was negative. ? ? ?OB History   ? ? Gravida  ?1  ? Para  ?0  ? Term  ?0  ? Preterm  ?0  ? AB  ?0  ? Living  ?0  ?  ? ? SAB  ?0  ? IAB  ?0  ? Ectopic  ?0  ? Multiple  ?0  ? Live Births  ?0  ?   ?  ?  ? ? ?Past Medical History:  ?Diagnosis Date  ? ADHD   ? Anxiety   ? Dysarthria   ? after having Covid-19 in January 2020  ? Headache   ? ? ?Past Surgical History:  ?Procedure Laterality Date  ? MOUTH SURGERY    ? ? ?Family History  ?Problem Relation Age of Onset  ? Diabetes Maternal Grandmother   ? Heart disease Maternal Grandmother   ? Heart disease Maternal Grandfather   ? Breast cancer Paternal Grandmother   ?     Age 33's  ? Heart disease Paternal Grandfather   ? Healthy Mother   ? Myasthenia gravis Father   ?     20+ years ago  ? ? ?Social History  ? ?Tobacco Use  ? Smoking status: Never  ? Smokeless tobacco: Never  ?Vaping Use  ? Vaping Use: Never used  ?Substance Use Topics  ? Alcohol use: Not Currently  ?  Comment: SOCIAL   ? Drug use: Never  ? ? ?Allergies:  ?Allergies  ?Allergen Reactions  ? Codeine Nausea And Vomiting  ? Hydrocodone   ?  Altered mental status, extreme anxiety  ? Mushroom Extract Complex Other (See Comments)  ?  Mouth soreness.  ? Penicillins Nausea And Vomiting and Rash  ? ? ?Medications Prior to Admission  ?Medication Sig Dispense Refill Last Dose  ? Prenatal Vit-Fe  Fumarate-FA (PRENATAL MULTIVITAMIN) TABS tablet Take 1 tablet by mouth daily at 12 noon.   11/17/2021  ? amphetamine-dextroamphetamine (ADDERALL XR) 20 MG 24 hr capsule Take 1 capsule (20 mg total) by mouth every morning. 30 capsule 0   ? norethindrone-ethinyl estradiol (JUNEL 1/20) 1-20 MG-MCG tablet Take 1 tablet by mouth daily. 3 Package 4   ? ? ?Review of Systems  ?Constitutional:  Positive for chills.  ?HENT:  Positive for ear pain (left) and sore throat.   ?Respiratory:  Positive for cough. Negative for shortness of breath.   ?Cardiovascular:  Negative for chest pain.  ?Gastrointestinal:  Positive for nausea and vomiting. Negative for abdominal pain and diarrhea.  ?Genitourinary:  Negative for dysuria, frequency, urgency and vaginal bleeding.  ?Physical Exam  ? ?Blood pressure 114/63, pulse 98, temperature 98 ?F (36.7 ?C), temperature source Oral, resp. rate 16, height 5\' 3"  (1.6 m), weight 86.6 kg, last menstrual period 06/28/2021, SpO2 98 %. ? ?Physical Exam ?Vitals and nursing  note reviewed.  ?Constitutional:   ?   General: She is not in acute distress. ?   Appearance: Normal appearance.  ?HENT:  ?   Head: Normocephalic and atraumatic.  ?   Right Ear: Tympanic membrane, ear canal and external ear normal. Tympanic membrane is not bulging.  ?   Left Ear: Tympanic membrane, ear canal and external ear normal. Tenderness present. Tympanic membrane is not bulging.  ?   Mouth/Throat:  ?   Lips: Pink.  ?   Mouth: Mucous membranes are moist.  ?   Pharynx: Oropharynx is clear. Uvula midline. No pharyngeal swelling, oropharyngeal exudate, posterior oropharyngeal erythema or uvula swelling.  ?   Tonsils: No tonsillar exudate or tonsillar abscesses.  ?Cardiovascular:  ?   Rate and Rhythm: Regular rhythm. Tachycardia present.  ?   Heart sounds: Normal heart sounds.  ?Pulmonary:  ?   Effort: Pulmonary effort is normal. No respiratory distress.  ?   Breath sounds: Normal breath sounds. No stridor. No wheezing or rhonchi.   ?Abdominal:  ?   General: There is no distension.  ?   Palpations: Abdomen is soft. There is no mass.  ?   Tenderness: There is no abdominal tenderness. There is no guarding or rebound.  ?   Hernia: No hernia is present.  ?Musculoskeletal:     ?   General: Normal range of motion.  ?   Cervical back: Normal range of motion.  ?Skin: ?   General: Skin is warm and dry.  ?Neurological:  ?   General: No focal deficit present.  ?   Mental Status: She is alert and oriented to person, place, and time.  ?Psychiatric:     ?   Mood and Affect: Mood normal.     ?   Behavior: Behavior normal.  ?FHT 154 ? ?Results for orders placed or performed during the hospital encounter of 11/17/21 (from the past 24 hour(s))  ?Urinalysis, Routine w reflex microscopic Urine, Clean Catch     Status: Abnormal  ? Collection Time: 11/17/21 10:36 AM  ?Result Value Ref Range  ? Color, Urine AMBER (A) YELLOW  ? APPearance HAZY (A) CLEAR  ? Specific Gravity, Urine 1.024 1.005 - 1.030  ? pH 5.0 5.0 - 8.0  ? Glucose, UA NEGATIVE NEGATIVE mg/dL  ? Hgb urine dipstick NEGATIVE NEGATIVE  ? Bilirubin Urine NEGATIVE NEGATIVE  ? Ketones, ur 5 (A) NEGATIVE mg/dL  ? Protein, ur 30 (A) NEGATIVE mg/dL  ? Nitrite NEGATIVE NEGATIVE  ? Leukocytes,Ua TRACE (A) NEGATIVE  ? RBC / HPF 0-5 0 - 5 RBC/hpf  ? WBC, UA 6-10 0 - 5 WBC/hpf  ? Bacteria, UA MANY (A) NONE SEEN  ? Squamous Epithelial / LPF 0-5 0 - 5  ? Mucus PRESENT   ? Non Squamous Epithelial 0-5 (A) NONE SEEN  ?Basic metabolic panel     Status: Abnormal  ? Collection Time: 11/17/21 11:22 AM  ?Result Value Ref Range  ? Sodium 135 135 - 145 mmol/L  ? Potassium 3.9 3.5 - 5.1 mmol/L  ? Chloride 105 98 - 111 mmol/L  ? CO2 21 (L) 22 - 32 mmol/L  ? Glucose, Bld 88 70 - 99 mg/dL  ? BUN 6 6 - 20 mg/dL  ? Creatinine, Ser 0.61 0.44 - 1.00 mg/dL  ? Calcium 9.2 8.9 - 10.3 mg/dL  ? GFR, Estimated >60 >60 mL/min  ? Anion gap 9 5 - 15  ?CBC     Status: Abnormal  ? Collection  Time: 11/17/21 11:22 AM  ?Result Value Ref Range  ? WBC  11.2 (H) 4.0 - 10.5 K/uL  ? RBC 4.19 3.87 - 5.11 MIL/uL  ? Hemoglobin 12.2 12.0 - 15.0 g/dL  ? HCT 35.3 (L) 36.0 - 46.0 %  ? MCV 84.2 80.0 - 100.0 fL  ? MCH 29.1 26.0 - 34.0 pg  ? MCHC 34.6 30.0 - 36.0 g/dL  ? RDW 12.8 11.5 - 15.5 %  ? Platelets 257 150 - 400 K/uL  ? nRBC 0.0 0.0 - 0.2 %  ?Resp Panel by RT-PCR (Flu A&B, Covid) Nasopharyngeal Swab     Status: None  ? Collection Time: 11/17/21 11:22 AM  ? Specimen: Nasopharyngeal Swab; Nasopharyngeal(NP) swabs in vial transport medium  ?Result Value Ref Range  ? SARS Coronavirus 2 by RT PCR NEGATIVE NEGATIVE  ? Influenza A by PCR NEGATIVE NEGATIVE  ? Influenza B by PCR NEGATIVE NEGATIVE  ? ?MAU Course  ?Procedures ?LR ?Zofran ?Compazine ? ?MDM ?Labs ordered and reviewed. UA with many bacteria, cultures sent. Suspect GI process. ?1250: still feels nausea, no vomiting, will try Compazine. ?1420: Feeling better, tolerating po. Discussed supportive care. Stable for discharge home. ?Assessment and Plan  ? ?1. [redacted] weeks gestation of pregnancy   ?2. Dehydration   ?3. Viral gastroenteritis   ? ?Discharge home ?Follow up with OB as scheduled ?Maintain hydration ?Rx Compazine ?Return precautions ? ?Allergies as of 11/17/2021   ? ?   Reactions  ? Codeine Nausea And Vomiting  ? Hydrocodone   ? Altered mental status, extreme anxiety  ? Mushroom Extract Complex Other (See Comments)  ? Mouth soreness.  ? Penicillins Nausea And Vomiting, Rash  ? ?  ? ?  ?Medication List  ?  ? ?STOP taking these medications   ? ?amphetamine-dextroamphetamine 20 MG 24 hr capsule ?Commonly known as: ADDERALL XR ?  ?norethindrone-ethinyl estradiol 1-20 MG-MCG tablet ?Commonly known as: Junel 1/20 ?  ? ?  ? ?TAKE these medications   ? ?prenatal multivitamin Tabs tablet ?Take 1 tablet by mouth daily at 12 noon. ?  ?prochlorperazine 10 MG tablet ?Commonly known as: COMPAZINE ?Take 1 tablet (10 mg total) by mouth every 8 (eight) hours as needed for nausea or vomiting. ?  ? ?  ? ? ?Donette Larry,  CNM ?11/17/2021, 12:53 PM  ?

## 2021-11-18 LAB — CULTURE, OB URINE: Culture: <10000 — AB

## 2021-12-08 DIAGNOSIS — Z363 Encounter for antenatal screening for malformations: Secondary | ICD-10-CM | POA: Diagnosis not present

## 2021-12-08 DIAGNOSIS — Z361 Encounter for antenatal screening for raised alphafetoprotein level: Secondary | ICD-10-CM | POA: Diagnosis not present

## 2021-12-08 DIAGNOSIS — Z3A19 19 weeks gestation of pregnancy: Secondary | ICD-10-CM | POA: Diagnosis not present

## 2022-01-05 DIAGNOSIS — Z3A23 23 weeks gestation of pregnancy: Secondary | ICD-10-CM | POA: Diagnosis not present

## 2022-01-05 DIAGNOSIS — Z362 Encounter for other antenatal screening follow-up: Secondary | ICD-10-CM | POA: Diagnosis not present

## 2022-02-03 DIAGNOSIS — Z3480 Encounter for supervision of other normal pregnancy, unspecified trimester: Secondary | ICD-10-CM | POA: Diagnosis not present

## 2022-02-03 DIAGNOSIS — Z23 Encounter for immunization: Secondary | ICD-10-CM | POA: Diagnosis not present

## 2022-02-11 DIAGNOSIS — O99810 Abnormal glucose complicating pregnancy: Secondary | ICD-10-CM | POA: Diagnosis not present

## 2022-04-07 DIAGNOSIS — Z3685 Encounter for antenatal screening for Streptococcus B: Secondary | ICD-10-CM | POA: Diagnosis not present

## 2022-04-07 LAB — OB RESULTS CONSOLE GBS: GBS: NEGATIVE

## 2022-04-10 ENCOUNTER — Inpatient Hospital Stay (HOSPITAL_COMMUNITY)
Admission: AD | Admit: 2022-04-10 | Discharge: 2022-04-10 | Disposition: A | Discharge status: Home / Self Care | Payer: BC Managed Care – PPO | Attending: Obstetrics & Gynecology | Admitting: Obstetrics & Gynecology

## 2022-04-10 ENCOUNTER — Encounter (HOSPITAL_COMMUNITY): Payer: Self-pay | Admitting: Obstetrics & Gynecology

## 2022-04-10 DIAGNOSIS — O479 False labor, unspecified: Secondary | ICD-10-CM

## 2022-04-10 DIAGNOSIS — Z3689 Encounter for other specified antenatal screening: Secondary | ICD-10-CM | POA: Diagnosis not present

## 2022-04-10 DIAGNOSIS — F900 Attention-deficit hyperactivity disorder, predominantly inattentive type: Secondary | ICD-10-CM | POA: Diagnosis not present

## 2022-04-10 DIAGNOSIS — Z3A36 36 weeks gestation of pregnancy: Secondary | ICD-10-CM | POA: Insufficient documentation

## 2022-04-10 DIAGNOSIS — O4703 False labor before 37 completed weeks of gestation, third trimester: Secondary | ICD-10-CM | POA: Insufficient documentation

## 2022-04-10 DIAGNOSIS — Z3493 Encounter for supervision of normal pregnancy, unspecified, third trimester: Secondary | ICD-10-CM

## 2022-04-10 LAB — URINALYSIS, ROUTINE W REFLEX MICROSCOPIC
Bilirubin Urine: NEGATIVE
Glucose, UA: NEGATIVE mg/dL
Ketones, ur: 5 mg/dL — AB
Leukocytes,Ua: NEGATIVE
Nitrite: NEGATIVE
Protein, ur: NEGATIVE mg/dL
Specific Gravity, Urine: 1.014 (ref 1.005–1.030)
pH: 6 (ref 5.0–8.0)

## 2022-04-10 NOTE — MAU Note (Signed)
Lisa Duncan is a 26 y.o. at [redacted]w[redacted]d here in MAU reporting: Feeling abdominal pressure and having DFM since 220pm. Pt thinks she lost her mucous plug and having some bleeding, was at first heavy like a period and has since slowed.  LMP:  Onset of complaint: today Pain score: 7/10 Vitals:   04/10/22 1639  BP: 114/72  Pulse: 97  Resp: 16  Temp: 98.2 F (36.8 C)  SpO2: 100%     FHT:148  Lab orders placed from triage: UA

## 2022-04-10 NOTE — MAU Provider Note (Signed)
Event Date/Time   First Provider Initiated Contact with Patient 04/10/22 1706     S: Ms. Lisa Duncan is a 26 y.o. G1P0000 at [redacted]w[redacted]d  who presents to MAU today complaining contractions frequently but is unsure how often. She endorses vaginal bleeding that happened in one gush on her clothes. She denies LOF. She reports normal fetal movement since her arrival to MAU    O: BP 114/72 (BP Location: Right Arm)   Pulse 97   Temp 98.2 F (36.8 C) (Oral)   Resp 16   Ht 5\' 3"  (1.6 m)   Wt 96.1 kg   LMP 06/28/2021   SpO2 100%   BMI 37.52 kg/m  GENERAL: Well-developed, well-nourished female in no acute distress.  HEAD: Normocephalic, atraumatic.  CHEST: Normal effort of breathing, regular heart rate ABDOMEN: Soft, nontender, gravid PELVIC: SSE- small amount of brown discharge, no bleeding noted on exam  Cervical exam:  Dilation: 1 Effacement (%): 50 Exam by:: C. Neil,CNM   Fetal Monitoring: Baseline: 130 Variability: moderate Accelerations: 15x15 Decelerations: none Contractions: irregular uc's   A: 1. NST (non-stress test) reactive   2. Movement of fetus present during pregnancy in third trimester   3. Braxton Hick's contraction   4. [redacted] weeks gestation of pregnancy    P: -Discharge home in stable condition -Third trimester precautions discussed -Patient advised to follow-up with OB as scheduled for prenatal care -Patient may return to MAU as needed or if her condition were to change or worsen   [phone], Rolm Bookbinder 04/10/2022 5:41 PM

## 2022-04-10 NOTE — Discharge Instructions (Signed)

## 2022-04-13 ENCOUNTER — Inpatient Hospital Stay (EMERGENCY_DEPARTMENT_HOSPITAL)
Admission: AD | Admit: 2022-04-13 | Discharge: 2022-04-13 | Disposition: A | Discharge status: Home / Self Care | Payer: BC Managed Care – PPO | Source: Home / Self Care | Attending: Obstetrics & Gynecology | Admitting: Obstetrics & Gynecology

## 2022-04-13 ENCOUNTER — Other Ambulatory Visit: Payer: Self-pay

## 2022-04-13 ENCOUNTER — Encounter (HOSPITAL_COMMUNITY): Payer: Self-pay | Admitting: Obstetrics & Gynecology

## 2022-04-13 DIAGNOSIS — O26893 Other specified pregnancy related conditions, third trimester: Secondary | ICD-10-CM | POA: Diagnosis not present

## 2022-04-13 DIAGNOSIS — Z23 Encounter for immunization: Secondary | ICD-10-CM | POA: Diagnosis not present

## 2022-04-13 DIAGNOSIS — O471 False labor at or after 37 completed weeks of gestation: Secondary | ICD-10-CM

## 2022-04-13 DIAGNOSIS — O9902 Anemia complicating childbirth: Secondary | ICD-10-CM | POA: Diagnosis not present

## 2022-04-13 DIAGNOSIS — Z3A37 37 weeks gestation of pregnancy: Secondary | ICD-10-CM

## 2022-04-13 DIAGNOSIS — Z8616 Personal history of COVID-19: Secondary | ICD-10-CM | POA: Diagnosis not present

## 2022-04-13 DIAGNOSIS — D649 Anemia, unspecified: Secondary | ICD-10-CM | POA: Diagnosis not present

## 2022-04-13 NOTE — MAU Provider Note (Signed)
S: Ms. Lisa Duncan is a 26 y.o. G1P0000 at [redacted]w[redacted]d  who presents to MAU today complaining contractions q 7 minutes since this morning. She was checked in the office today and the contractions became more consistent after that. She denies vaginal bleeding. She denies LOF. She reports normal fetal movement.    O: BP 123/75 (BP Location: Right Arm)   Pulse 82   Temp 97.8 F (36.6 C) (Oral)   Resp 16   Wt 95.7 kg   LMP 06/28/2021   SpO2 99%   BMI 37.38 kg/m  GENERAL: Well-developed, well-nourished female in no acute distress.  HEAD: Normocephalic, atraumatic.  CHEST: Normal effort of breathing, regular heart rate ABDOMEN: Soft, nontender, gravid  Cervical exam:  Dilation: 1.5 Effacement (%): 50 Cervical Position: Posterior Exam by:: Anastasio Champion, RN   Fetal Monitoring: Baseline: 130 Variability: moderate Accelerations: 15x15 Decelerations: none Contractions: UI  Patient was offered d/c home since cervix was unchanged from office exam or be rechecked in 1 hour. Patient opted to be discharged home  A: [facility] at [redacted]w[redacted]d  False labor  P: -Discharge home in stable condition -Labor precautions discussed -Patient advised to follow-up with OB as scheduled for prenatal care -Patient may return to MAU as needed or if her condition were to change or worsen   Rolm Bookbinder, [place] 04/13/2022 11:48 PM

## 2022-04-13 NOTE — Discharge Instructions (Signed)

## 2022-04-13 NOTE — MAU Note (Addendum)
Lisa Duncan is a 26 y.o. at [redacted]w[redacted]d here in MAU reporting: ctx that started today at 2000 every 3-5 minutes. Pt denies LOF or VB. Pt states she has clear mucous discharge. +FM  Onset of complaint: 04/13/2022 Pain score: 7/10 Vitals:   04/13/22 2324  BP: 123/75  Pulse: 82  Resp: 16  Temp: 97.8 F (36.6 C)     FHT: 157 Lab orders placed from triage:  UA

## 2022-04-14 LAB — URINALYSIS, ROUTINE W REFLEX MICROSCOPIC
Bilirubin Urine: NEGATIVE
Glucose, UA: NEGATIVE mg/dL
Hgb urine dipstick: NEGATIVE
Ketones, ur: 5 mg/dL — AB
Nitrite: NEGATIVE
Protein, ur: 30 mg/dL — AB
Specific Gravity, Urine: 1.027 (ref 1.005–1.030)
pH: 5 (ref 5.0–8.0)

## 2022-04-16 ENCOUNTER — Inpatient Hospital Stay (HOSPITAL_COMMUNITY): Payer: BC Managed Care – PPO | Admitting: Anesthesiology

## 2022-04-16 ENCOUNTER — Inpatient Hospital Stay (HOSPITAL_COMMUNITY)
Admission: AD | Admit: 2022-04-16 | Discharge: 2022-04-18 | DRG: 807 | Disposition: A | Discharge status: Home / Self Care | Payer: BC Managed Care – PPO | Attending: Obstetrics and Gynecology | Admitting: Obstetrics and Gynecology

## 2022-04-16 ENCOUNTER — Encounter (HOSPITAL_COMMUNITY): Payer: Self-pay | Admitting: Obstetrics and Gynecology

## 2022-04-16 DIAGNOSIS — Z3A37 37 weeks gestation of pregnancy: Secondary | ICD-10-CM

## 2022-04-16 DIAGNOSIS — Z8616 Personal history of COVID-19: Secondary | ICD-10-CM | POA: Diagnosis not present

## 2022-04-16 DIAGNOSIS — O26893 Other specified pregnancy related conditions, third trimester: Secondary | ICD-10-CM | POA: Diagnosis present

## 2022-04-16 LAB — CBC
HCT: 29.9 % — ABNORMAL LOW (ref 36.0–46.0)
Hemoglobin: 10.3 g/dL — ABNORMAL LOW (ref 12.0–15.0)
MCH: 27.2 pg (ref 26.0–34.0)
MCHC: 34.4 g/dL (ref 30.0–36.0)
MCV: 79.1 fL — ABNORMAL LOW (ref 80.0–100.0)
Platelets: 260 10*3/uL (ref 150–400)
RBC: 3.78 MIL/uL — ABNORMAL LOW (ref 3.87–5.11)
RDW: 13.3 % (ref 11.5–15.5)
WBC: 12.5 10*3/uL — ABNORMAL HIGH (ref 4.0–10.5)
nRBC: 0 % (ref 0.0–0.2)

## 2022-04-16 LAB — TYPE AND SCREEN
ABO/RH(D): O POS
Antibody Screen: NEGATIVE

## 2022-04-16 LAB — RPR: RPR Ser Ql: NONREACTIVE

## 2022-04-16 LAB — POCT FERN TEST: POCT Fern Test: POSITIVE

## 2022-04-16 MED ORDER — OXYCODONE HCL 5 MG PO TABS: 5.0000 mg | ORAL_TABLET | ORAL | Status: DC | PRN

## 2022-04-16 MED ORDER — FENTANYL CITRATE (PF) 100 MCG/2ML IJ SOLN
100.0000 ug | INTRAMUSCULAR | Status: DC | PRN
  Administered 2022-04-16: 100 ug via INTRAVENOUS
  Filled 2022-04-16: qty 2

## 2022-04-16 MED ORDER — OXYCODONE-ACETAMINOPHEN 5-325 MG PO TABS: 2.0000 | ORAL_TABLET | ORAL | Status: DC | PRN

## 2022-04-16 MED ORDER — PRENATAL MULTIVITAMIN CH
1.0000 | ORAL_TABLET | Freq: Every day | ORAL | Status: DC
  Administered 2022-04-16 – 2022-04-17 (×2): 1 via ORAL
  Filled 2022-04-16 (×2): qty 1

## 2022-04-16 MED ORDER — PHENYLEPHRINE 80 MCG/ML (10ML) SYRINGE FOR IV PUSH (FOR BLOOD PRESSURE SUPPORT): 80.0000 ug | PREFILLED_SYRINGE | INTRAVENOUS | Status: DC | PRN

## 2022-04-16 MED ORDER — SENNOSIDES-DOCUSATE SODIUM 8.6-50 MG PO TABS
2.0000 | ORAL_TABLET | ORAL | Status: DC
Start: 2022-04-16 — End: 2022-04-18
  Administered 2022-04-16 – 2022-04-17 (×2): 2 via ORAL
  Filled 2022-04-16 (×2): qty 2

## 2022-04-16 MED ORDER — BENZOCAINE-MENTHOL 20-0.5 % EX AERO
1.0000 | INHALATION_SPRAY | CUTANEOUS | Status: DC | PRN
  Filled 2022-04-16: qty 56

## 2022-04-16 MED ORDER — OXYCODONE HCL 5 MG PO TABS: 10.0000 mg | ORAL_TABLET | ORAL | Status: DC | PRN

## 2022-04-16 MED ORDER — LACTATED RINGERS IV SOLN
500.0000 mL | Freq: Once | INTRAVENOUS | Status: AC
  Administered 2022-04-16: 500 mL via INTRAVENOUS

## 2022-04-16 MED ORDER — OXYTOCIN-SODIUM CHLORIDE 30-0.9 UT/500ML-% IV SOLN
2.5000 [IU]/h | INTRAVENOUS | Status: DC
Start: 2022-04-16 — End: 2022-04-16
  Administered 2022-04-16: 2.5 [IU]/h via INTRAVENOUS

## 2022-04-16 MED ORDER — IBUPROFEN 600 MG PO TABS
600.0000 mg | ORAL_TABLET | Freq: Four times a day (QID) | ORAL | Status: DC
  Administered 2022-04-16 – 2022-04-17 (×3): 600 mg via ORAL
  Filled 2022-04-16 (×6): qty 1

## 2022-04-16 MED ORDER — ONDANSETRON HCL 4 MG/2ML IJ SOLN: 4.0000 mg | INTRAMUSCULAR | Status: DC | PRN

## 2022-04-16 MED ORDER — FLEET ENEMA 7-19 GM/118ML RE ENEM: 1.0000 | ENEMA | RECTAL | Status: DC | PRN

## 2022-04-16 MED ORDER — TETANUS-DIPHTH-ACELL PERTUSSIS 5-2.5-18.5 LF-MCG/0.5 IM SUSY: 0.5000 mL | PREFILLED_SYRINGE | Freq: Once | INTRAMUSCULAR | Status: DC

## 2022-04-16 MED ORDER — ONDANSETRON HCL 4 MG/2ML IJ SOLN: 4.0000 mg | Freq: Four times a day (QID) | INTRAMUSCULAR | Status: DC | PRN

## 2022-04-16 MED ORDER — LIDOCAINE HCL (PF) 1 % IJ SOLN
INTRAMUSCULAR | Status: DC | PRN
  Administered 2022-04-16 (×2): 5 mL via EPIDURAL

## 2022-04-16 MED ORDER — FENTANYL-BUPIVACAINE-NACL 0.5-0.125-0.9 MG/250ML-% EP SOLN
12.0000 mL/h | EPIDURAL | Status: DC | PRN
  Administered 2022-04-16: 12 mL/h via EPIDURAL
  Filled 2022-04-16: qty 250

## 2022-04-16 MED ORDER — TERBUTALINE SULFATE 1 MG/ML IJ SOLN: 0.2500 mg | Freq: Once | INTRAMUSCULAR | Status: DC | PRN

## 2022-04-16 MED ORDER — EPHEDRINE 5 MG/ML INJ: 10.0000 mg | INTRAVENOUS | Status: DC | PRN

## 2022-04-16 MED ORDER — OXYTOCIN-SODIUM CHLORIDE 30-0.9 UT/500ML-% IV SOLN
1.0000 m[IU]/min | INTRAVENOUS | Status: DC
  Administered 2022-04-16: 2 m[IU]/min via INTRAVENOUS
  Filled 2022-04-16: qty 500

## 2022-04-16 MED ORDER — ZOLPIDEM TARTRATE 5 MG PO TABS: 5.0000 mg | ORAL_TABLET | Freq: Every evening | ORAL | Status: DC | PRN

## 2022-04-16 MED ORDER — WITCH HAZEL-GLYCERIN EX PADS: 1.0000 | MEDICATED_PAD | CUTANEOUS | Status: DC | PRN

## 2022-04-16 MED ORDER — LACTATED RINGERS IV SOLN
INTRAVENOUS | Status: DC
Start: 2022-04-16 — End: 2022-04-16

## 2022-04-16 MED ORDER — OXYCODONE-ACETAMINOPHEN 5-325 MG PO TABS: 1.0000 | ORAL_TABLET | ORAL | Status: DC | PRN

## 2022-04-16 MED ORDER — LACTATED RINGERS IV SOLN
500.0000 mL | INTRAVENOUS | Status: DC | PRN
  Administered 2022-04-16: 500 mL via INTRAVENOUS

## 2022-04-16 MED ORDER — FENTANYL CITRATE (PF) 100 MCG/2ML IJ SOLN
100.0000 ug | Freq: Once | INTRAMUSCULAR | Status: AC
  Administered 2022-04-16: 100 ug via INTRAVENOUS
  Filled 2022-04-16: qty 2

## 2022-04-16 MED ORDER — COCONUT OIL OIL: 1.0000 | TOPICAL_OIL | Status: DC | PRN

## 2022-04-16 MED ORDER — DIPHENHYDRAMINE HCL 25 MG PO CAPS: 25.0000 mg | ORAL_CAPSULE | Freq: Four times a day (QID) | ORAL | Status: DC | PRN

## 2022-04-16 MED ORDER — SOD CITRATE-CITRIC ACID 500-334 MG/5ML PO SOLN: 30.0000 mL | ORAL | Status: DC | PRN

## 2022-04-16 MED ORDER — ACETAMINOPHEN 325 MG PO TABS: 650.0000 mg | ORAL_TABLET | ORAL | Status: DC | PRN

## 2022-04-16 MED ORDER — LIDOCAINE HCL (PF) 1 % IJ SOLN: 30.0000 mL | INTRAMUSCULAR | Status: DC | PRN

## 2022-04-16 MED ORDER — ONDANSETRON HCL 4 MG PO TABS: 4.0000 mg | ORAL_TABLET | ORAL | Status: DC | PRN

## 2022-04-16 MED ORDER — SIMETHICONE 80 MG PO CHEW: 80.0000 mg | CHEWABLE_TABLET | ORAL | Status: DC | PRN

## 2022-04-16 MED ORDER — DIPHENHYDRAMINE HCL 50 MG/ML IJ SOLN: 12.5000 mg | INTRAMUSCULAR | Status: DC | PRN

## 2022-04-16 MED ORDER — ACETAMINOPHEN 325 MG PO TABS
650.0000 mg | ORAL_TABLET | ORAL | Status: DC | PRN
  Administered 2022-04-16 – 2022-04-18 (×5): 650 mg via ORAL
  Filled 2022-04-16 (×5): qty 2

## 2022-04-16 MED ORDER — DIBUCAINE (PERIANAL) 1 % EX OINT: 1.0000 | TOPICAL_OINTMENT | CUTANEOUS | Status: DC | PRN

## 2022-04-16 MED ORDER — OXYTOCIN BOLUS FROM INFUSION
333.0000 mL | Freq: Once | INTRAVENOUS | Status: AC
  Administered 2022-04-16: 333 mL via INTRAVENOUS

## 2022-04-16 NOTE — MAU Note (Signed)
.  Lisa Duncan is a 26 y.o. at [redacted]w[redacted]d here in MAU reporting irregular ctxs all day that became more regular tonight. Started leaking clear fld around 2315. Good FM. Denies VB.  Onset of complaint: tonight Pain score: 9 Vitals:   04/16/22 0026 04/16/22 0027  BP:  115/68  Pulse: 94   Resp: 17   Temp: 98 F (36.7 C)   SpO2: 100%      FHT:123 Lab orders placed from triage:  mau labor eval

## 2022-04-16 NOTE — Progress Notes (Signed)
Operative Delivery Note At 11:45 AM a viable female was delivered via Vaginal, Spontaneous.  Presentation: vertex; Position: Left,, Occiput,, Anterior.  Delivery of the head:  First maneuver: 04/16/2022 11:43 AM, Suprapubic Pressure Second maneuver: 04/16/2022 11:43 AM, McRoberts Third maneuver: 04/16/2022 11:44 AM,  20 degree rotation of posterior shoulder Fourth maneuver: ,   Fifth maneuver: ,   Sixth maneuver: ,    Total time for delivery of shoulder 60-75 seconds  Verbal consent: unable to obtain verbal consent due to urgency of delivery .  APGAR:8 , 8; weight pending .   Placenta status: intact, .   Cord: 3 vessels with the following complications: nuchal cord x 1.  Cord pH: pending  Anesthesia:   Episiotomy: Midline second degree repaired Lacerations:  Suture Repair: 2.0 vicryl rapide Est. Blood Loss (mL):  200  Mom to postpartum.  Baby to Couplet care / Skin to Skin.  Roselle Locus II 04/16/2022, 12:01 PM

## 2022-04-16 NOTE — Anesthesia Preprocedure Evaluation (Signed)
Anesthesia Evaluation  Patient identified by MRN, date of birth, ID band Patient awake    Reviewed: Allergy & Precautions, Patient's Chart, lab work & pertinent test results  Airway Mallampati: II       Dental no notable dental hx.    Pulmonary    Pulmonary exam normal        Cardiovascular negative cardio ROS Normal cardiovascular exam     Neuro/Psych  Headaches, PSYCHIATRIC DISORDERS Anxiety Dysarthria - post Covid    GI/Hepatic Neg liver ROS, GERD  ,  Endo/Other  Obesity  Renal/GU negative Renal ROS  negative genitourinary   Musculoskeletal negative musculoskeletal ROS (+)   Abdominal (+) + obese,   Peds  Hematology  (+) Blood dyscrasia, anemia ,   Anesthesia Other Findings   Reproductive/Obstetrics (+) Pregnancy                             Anesthesia Physical Anesthesia Plan  ASA: 2  Anesthesia Plan: Epidural   Post-op Pain Management:    Induction:   PONV Risk Score and Plan:   Airway Management Planned: Natural Airway  Additional Equipment:   Intra-op Plan:   Post-operative Plan:   Informed Consent: I have reviewed the patients History and Physical, chart, labs and discussed the procedure including the risks, benefits and alternatives for the proposed anesthesia with the patient or authorized representative who has indicated his/her understanding and acceptance.       Plan Discussed with: Anesthesiologist  Anesthesia Plan Comments:         Anesthesia Quick Evaluation

## 2022-04-16 NOTE — Lactation Note (Signed)
This note was copied from a baby's chart. Lactation Consultation Note Birth parent stated baby is really sleepy. BF after delivery but has been very sleepy since. Newborn feeding habits, STS, I&O, supply and demand reviewed. Encouraged to feed baby 8-12 times/24 hours and with feeding cues.   Taught hand expression. Noted edema to breast. Shells given to wear in am, and hand pump for pre-pumping to evert nipple before latching. Also demonstrated finger stimulation to soften areola tissue before latching. Suggested to call for [facility] for next feeding.  Patient Name: Lisa Duncan Date: 04/16/2022 Reason for consult: Initial assessment;Early term 37-38.6wks;Primapara Age:25 hours  Maternal Data Has patient been taught Hand Expression?: Yes Does the patient have breastfeeding experience prior to this delivery?: No  Feeding    LATCH Score Latch: Repeated attempts needed to sustain latch, nipple held in mouth throughout feeding, stimulation needed to elicit sucking reflex.  Audible Swallowing: A few with stimulation  Type of Nipple: Everted at rest and after stimulation  Comfort (Breast/Nipple): Soft / non-tender  Hold (Positioning): Assistance needed to correctly position infant at breast and maintain latch.  LATCH Score: 7   Lactation Tools Discussed/Used Tools: Shells;Pump Breast pump type: Manual Pump Education: Setup, frequency, and cleaning Reason for Pumping: pre-pumping Pumping frequency: pre-pumping  Interventions Interventions: Breast feeding basics reviewed;Hand express;Pre-pump if needed;Reverse pressure;Shells;Hand pump;LC Services brochure  Discharge    Consult Status Consult Status: Follow-up Date: 04/17/22 Follow-up type: In-patient    Charyl Dancer 04/16/2022, 8:01 PM

## 2022-04-16 NOTE — H&P (Signed)
Lisa Duncan is a 26 y.o. female presenting for UCs today and leaking since about 11:15 pm. 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 OB History     Gravida  1   Para  0   Term  0   Preterm  0   AB  0   Living  0      SAB  0   IAB  0   Ectopic  0   Multiple  0   Live Births  0          Past Medical History:  Diagnosis Date   ADHD    Anxiety    Dysarthria    after having Covid-19 in January 2020   Headache    Past Surgical History:  Procedure Laterality Date   MOUTH SURGERY     Family History: family history includes Breast cancer in her paternal grandmother; Diabetes in her maternal grandmother; Healthy in her mother; Heart disease in her maternal grandfather, maternal grandmother, and paternal grandfather; Myasthenia gravis in her father; Vision loss in her mother. Social History:  reports that she has never smoked. She has never used smokeless tobacco. She reports that she does not currently use alcohol. She reports that she does not use drugs.     Maternal Diabetes: No Genetic Screening: Normal Maternal Ultrasounds/Referrals: Normal Fetal Ultrasounds or other Referrals:  None Maternal Substance Abuse:  No Significant Maternal Medications:  None Significant Maternal Lab Results:  Group B Strep negative Number of Prenatal Visits:greater than 3 verified prenatal visits Other Comments:  None  Review of Systems  Constitutional:  Negative for fever.   Maternal Medical History:  Reason for admission: Rupture of membranes and contractions.   Fetal activity: Perceived fetal activity is normal.     Dilation: 1 Effacement (%): 70 Station: -2 Exam by:: ADelford Field, RN Blood pressure 118/62, pulse 85, temperature 98.1 F (36.7 C), temperature source Oral, resp. rate 19, height 5\' 3"  (1.6 m), weight 96.6 kg, last menstrual period 06/28/2021, SpO2 100 %.   Fetal Exam Fetal State Assessment: Category I - tracings are normal.   Physical Exam Cardiovascular:      Rate and Rhythm: Normal rate.  Pulmonary:     Effort: Pulmonary effort is normal.     Prenatal labs: ABO, Rh: --/--/PENDING (07/29 0116) Antibody: PENDING (07/29 0116) Rubella:   RPR:    HBsAg:    HIV:    GBS: Negative/-- (07/20 0000)   Assessment/Plan: 26 yo G1P0 @ 37 4/7 wks with SROM   6/7 II 04/16/2022, 2:28 AM

## 2022-04-16 NOTE — Anesthesia Procedure Notes (Signed)
Epidural Patient location during procedure: OB Start time: 04/16/2022 5:34 AM End time: 04/16/2022 5:42 AM  Staffing Anesthesiologist: Mal Amabile, MD Performed: anesthesiologist   Preanesthetic Checklist Completed: patient identified, IV checked, site marked, risks and benefits discussed, surgical consent, monitors and equipment checked, pre-op evaluation and timeout performed  Epidural Patient position: sitting Prep: DuraPrep and site prepped and draped Patient monitoring: continuous pulse ox and blood pressure Approach: midline Location: L3-L4 Injection technique: LOR air  Needle:  Needle type: Tuohy  Needle gauge: 17 G Needle length: 9 cm and 9 Needle insertion depth: 5 cm Catheter type: closed end flexible Catheter size: 19 Gauge Catheter at skin depth: 10 cm Test dose: negative and Other  Assessment Events: blood not aspirated, injection not painful, no injection resistance, no paresthesia and negative IV test  Additional Notes Patient identified. Risks and benefits discussed including failed block, incomplete  Pain control, post dural puncture headache, nerve damage, paralysis, blood pressure Changes, nausea, vomiting, reactions to medications-both toxic and allergic and post Partum back pain. All questions were answered. Patient expressed understanding and wished to proceed. Sterile technique was used throughout procedure. Epidural site was Dressed with sterile barrier dressing. No paresthesias, signs of intravascular injection Or signs of intrathecal spread were encountered.  Patient was more comfortable after the epidural was dosed. Please see RN's note for documentation of vital signs and FHR which are stable. Reason for block:procedure for pain

## 2022-04-16 NOTE — Progress Notes (Signed)
IUPC not functioning>replaced FHT cat one

## 2022-04-16 NOTE — Progress Notes (Signed)
FHT cat one UCs q2-5 min Cx 3-4/90/-2/vtx IUPC placed Will start pitocin as needed  D/W patient

## 2022-04-16 NOTE — Anesthesia Postprocedure Evaluation (Signed)
Anesthesia Post Note  Patient: Lisa Duncan  Procedure(s) Performed: AN AD HOC LABOR EPIDURAL     Patient location during evaluation: Mother Baby Anesthesia Type: Epidural Level of consciousness: awake Pain management: satisfactory to patient Vital Signs Assessment: post-procedure vital signs reviewed and stable Respiratory status: spontaneous breathing Cardiovascular status: stable Anesthetic complications: no   No notable events documented.  Last Vitals:  Vitals:   04/16/22 1405 04/16/22 1505  BP: 115/69 117/69  Pulse: 86 85  Resp: 16 16  Temp: 37.4 C 36.9 C  SpO2: 100% 100%    Last Pain:  Vitals:   04/16/22 1505  TempSrc: Oral  PainSc: 0-No pain   Pain Goal: Patients Stated Pain Goal: 8 (04/16/22 0224)                 Cephus Shelling

## 2022-04-17 LAB — CBC
HCT: 27.8 % — ABNORMAL LOW (ref 36.0–46.0)
Hemoglobin: 9.4 g/dL — ABNORMAL LOW (ref 12.0–15.0)
MCH: 26.6 pg (ref 26.0–34.0)
MCHC: 33.8 g/dL (ref 30.0–36.0)
MCV: 78.8 fL — ABNORMAL LOW (ref 80.0–100.0)
Platelets: 221 10*3/uL (ref 150–400)
RBC: 3.53 MIL/uL — ABNORMAL LOW (ref 3.87–5.11)
RDW: 13.5 % (ref 11.5–15.5)
WBC: 13.4 10*3/uL — ABNORMAL HIGH (ref 4.0–10.5)
nRBC: 0 % (ref 0.0–0.2)

## 2022-04-17 NOTE — Social Work (Signed)
CSW received consult for hx of Anxiety and ADHD.  CSW met with MOB to offer support and complete assessment.     CSW introduced self and role. CSW observed FOB and maternal grandparents visiting with infant 'Aida Puffer.' CSW offered to return, however MOB declined and stated guests could stay for assessment. CSW informed MOB of the reason for consult and assessed current mood. MOB smiled as she shared she is doing well. MOB stated she was diagnosed with anxiety/ADHD in 2020. MOB was taking Adderral prior to the pregnancy and has not ever taken mediation or been to therapy for anxiety. MOB reported her pregnancy went well and denies any mental health concerns. MOB identified a strong support system in parents, siblings and extended family. MOB denies any current SI, HI or DV.   CSW provided education regarding the baby blues period versus PPD and provided resources. CSW provided the New Mom Checklist and encouraged MOB to self evaluate and contact a medical professional if symptoms are noted at any time.  CSW provided review of Sudden Infant Death Syndrome (SIDS) precautions.  MOB reported she has all infant essentials. MOB requested [facility] resources, which CSW provided. MOB identified [facility] for follow-up care. No additional needs at this time.  CSW identifies no further need for intervention and no barriers to discharge at this time.  Darra Lis, LCSW Clinical Social Work [employer]

## 2022-04-17 NOTE — Progress Notes (Signed)
Post Partum Day 1 Subjective: no complaints, up ad lib, voiding, tolerating PO, and + flatus  Objective: Blood pressure 113/60, pulse 64, temperature 98 F (36.7 C), temperature source Oral, resp. rate 18, height 5\' 3"  (1.6 m), weight 96.6 kg, last menstrual period 06/28/2021, SpO2 100 %, unknown if currently breastfeeding.  Physical Exam:  General: alert, cooperative, and no distress Lochia: appropriate Uterine Fundus: firm Incision: healing well DVT Evaluation: No evidence of DVT seen on physical exam.  Recent Labs    04/16/22 0120 04/17/22 0421  HGB 10.3* 9.4*  HCT 29.9* 27.8*    Assessment/Plan: Plan for discharge tomorrow Possible discharge later today, instructions reviewed D/W circumcision of newborn boy, risks reviewed. She states she understands and agrees.  LOS: 1 day   04/19/22, MD 04/17/2022, 7:31 AM

## 2022-04-17 NOTE — Lactation Note (Signed)
This note was copied from a baby's chart. Lactation Consultation Note  Patient Name: Lisa Duncan WYOVZ'C Date: 04/17/2022   Age:25 hours  LC attempted to visit with the dyad. The birthing parent was asleep in the bed and baby was asleep in the bassinet. Someone from lactation will follow up later.   Maternal Data    Feeding    LATCH Score                    Lactation Tools Discussed/Used    Interventions Interventions: DEBP  Discharge    Consult Status      Lisa Duncan 04/17/2022, 5:18 PM

## 2022-04-18 NOTE — Discharge Summary (Signed)
Postpartum Discharge Summary  Date of Service April 18, 2022     Patient Name: Lisa Duncan DOB: 04-Jul-1996 MRN: [id number]  Date of admission: 04/16/2022 Delivery date:04/16/2022  Delivering provider: Everlene Farrier  Date of discharge: 04/18/2022  Admitting diagnosis: Normal labor [O80, Z37.9] Intrauterine pregnancy: [redacted]w[redacted]d    Secondary diagnosis:  Principal Problem:   Normal labor  Additional problems: none    Discharge diagnosis: Term Pregnancy Delivered                                              Post partum procedures: none Augmentation: Pitocin Complications: None  Hospital course: Onset of Labor With Vaginal Delivery      26y.o. yo G1P1001 at 359w4das admitted in Latent Labor on 04/16/2022. Patient had an uncomplicated labor course as follows:  Membrane Rupture Time/Date: 11:15 PM ,04/15/2022   Delivery Method:Vaginal, Spontaneous  Episiotomy: Median  Lacerations:  2nd degree  Patient had an uncomplicated postpartum course.  She is ambulating, tolerating a regular diet, passing flatus, and urinating well. Patient is discharged home in stable condition on 04/18/22.  Newborn Data: Birth date:04/16/2022  Birth time:11:45 AM  Gender:Female  Living status:Living  Apgars:8 ,8  Weight:3010 g   Magnesium Sulfate received: No BMZ received: No Rhophylac:N/A MMR:N/A T-DaP:Given prenatally Flu: N/A Transfusion:No  Physical exam  Vitals:   04/17/22 0454 04/17/22 1500 04/17/22 2112 04/18/22 0517  BP: 113/60 (!) 112/59 118/71 113/67  Pulse: 64 72 76 61  Resp: 18 16 18 18   Temp: 98 F (36.7 C) 98.1 F (36.7 C) 97.8 F (36.6 C) 98.1 F (36.7 C)  TempSrc: Oral Oral Oral Oral  SpO2: 100% 100% 99% 100%  Weight:      Height:       General: alert, cooperative, and no distress Lochia: appropriate Uterine Fundus: firm Incision: Healing well with no significant drainage DVT Evaluation: No evidence of DVT seen on physical exam. Labs: Lab Results  Component Value  Date   WBC 13.4 (H) 04/17/2022   HGB 9.4 (L) 04/17/2022   HCT 27.8 (L) 04/17/2022   MCV 78.8 (L) 04/17/2022   PLT 221 04/17/2022      Latest Ref Rng & Units 11/17/2021   11:22 AM  CMP  Glucose 70 - 99 mg/dL 88   BUN 6 - 20 mg/dL 6   Creatinine 0.44 - 1.00 mg/dL 0.61   Sodium 135 - 145 mmol/L 135   Potassium 3.5 - 5.1 mmol/L 3.9   Chloride 98 - 111 mmol/L 105   CO2 22 - 32 mmol/L 21   Calcium 8.9 - 10.3 mg/dL 9.2    Edinburgh Score:    04/17/2022    5:35 PM  Edinburgh Postnatal Depression Scale Screening Tool  I have been able to laugh and see the funny side of things. 0  I have looked forward with enjoyment to things. 0  I have blamed myself unnecessarily when things went wrong. 1  I have been anxious or worried for no good reason. 2  I have felt scared or panicky for no good reason. 2  Things have been getting on top of me. 1  I have been so unhappy that I have had difficulty sleeping. 0  I have felt sad or miserable. 0  I have been so unhappy that I have been crying. 1  The  thought of harming myself has occurred to me. 0  Edinburgh Postnatal Depression Scale Total 7      After visit meds:  Allergies as of 04/18/2022       Reactions   Codeine Nausea And Vomiting   Hydrocodone    Altered mental status, extreme anxiety   Mushroom Extract Complex Other (See Comments)   Mouth soreness.   Penicillins Nausea And Vomiting, Rash        Medication List     STOP taking these medications    prochlorperazine 10 MG tablet Commonly known as: COMPAZINE       TAKE these medications    prenatal multivitamin Tabs tablet Take 1 tablet by mouth daily at 12 noon.         Discharge home in stable condition Infant Feeding: Breast Infant Disposition:home with mother Discharge instruction: per After Visit Summary and Postpartum booklet. Activity: Advance as tolerated. Pelvic rest for 6 weeks.  Diet: routine diet Anticipated Birth Control: Unsure Postpartum  Appointment:6 weeks Additional Postpartum F/U:  none Future Appointments: Future Appointments  Date Time Provider [place]  07/28/2022  2:00 PM Minette Brine, FNP [facility] None   Follow up Visit:      04/18/2022 Cyril Mourning, MD

## 2022-04-18 NOTE — Lactation Note (Signed)
This note was copied from a baby's chart. Lactation Consultation Note  Patient Name: Lisa Duncan [id number] Date: 04/18/2022 Reason for consult: Follow-up assessment;Primapara;1st time breastfeeding;Early term 37-38.6wks;Infant weight loss (4 % weight loss , per birthing parent- pumping and bottle feeding. Would like to breast feed. Milk coming in both breast. pumped off 10 ml) Age:25 hours LC reviewed supply and demand / importance of being consistent with pumping both breast for 15 -20 mins 8-10 times day.  LC recommended when mom goes home today to keep pumping and by tonight probably more volume,give the baby and appetizer from a bottle, ( 10 ml and then latch the baby. LC reassured mom when the milk is in the baby will be more motivated to stay latched for longer period of time.  LC reviewed engorgement prevention and tx.  LC recommended a LC O/P appt and mom receptive and aware LC O/P will call her. Mom has the resource number.  Maternal Data    Feeding Mother's Current Feeding Choice: Breast Milk and Formula   Lactation Tools Discussed/Used  DEBP   Interventions Education      Discharge Discharge Education: Engorgement and breast care;Warning signs for feeding baby;Outpatient recommendation;Outpatient Epic message sent Pump: DEBP;Personal  Consult Status Consult Status: Complete Date: 04/18/22    Kathrin Greathouse 04/18/2022, 11:49 AM

## 2022-04-25 ENCOUNTER — Telehealth (HOSPITAL_COMMUNITY): Payer: Self-pay | Admitting: *Deleted

## 2022-04-25 NOTE — Telephone Encounter (Signed)
Mom reports feeling good. No concerns about herself at this time. EPDS= 7(Hospital score=7) Mom reports baby is doing well. Feeding, peeing, and pooping without difficulty. Safe sleep reviewed. Mom reports no concerns about baby at present.  Duffy Rhody, RN 04-25-2022 at 1:55pm

## 2022-04-30 IMAGING — US US ABDOMEN LIMITED RUQ/ASCITES
1 series · 14 of 25 positions shown · non-contrast
Comparison: None.

CLINICAL DATA: Epigastric pain x1 month with nausea.

EXAM:
ULTRASOUND ABDOMEN LIMITED RIGHT UPPER QUADRANT

[Series 1: us abdomen limited ruq (liver/gb) · 14 of 69 slices shown]
[im 1/69]
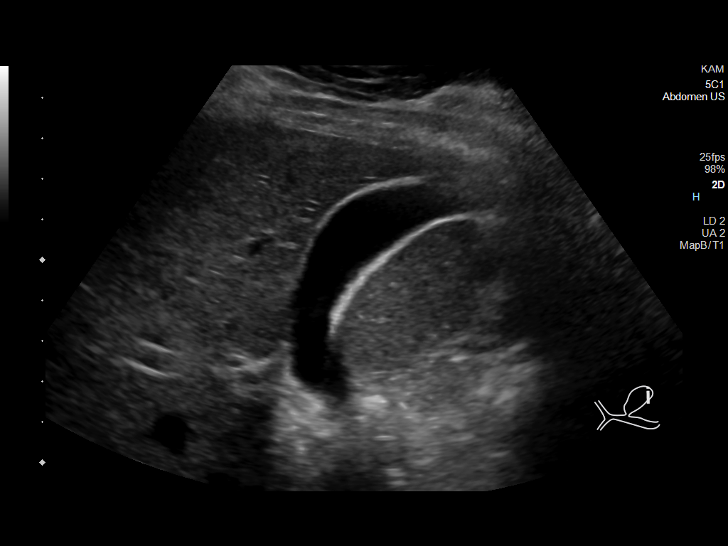
[im 6/69]
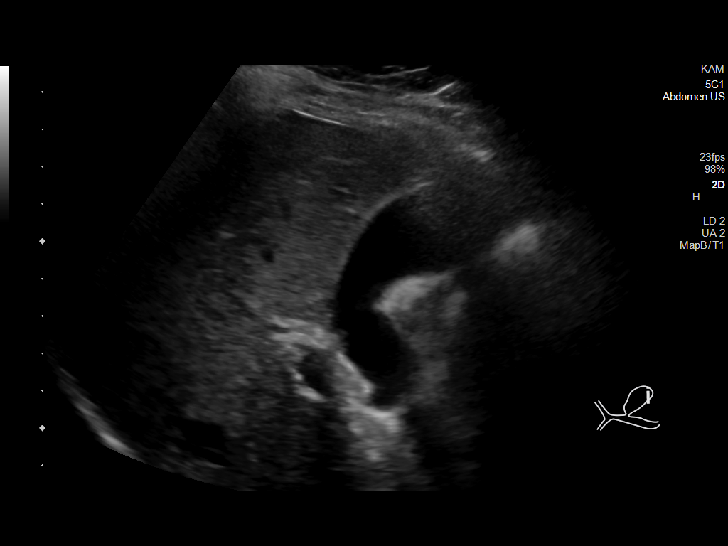
[im 12/69]
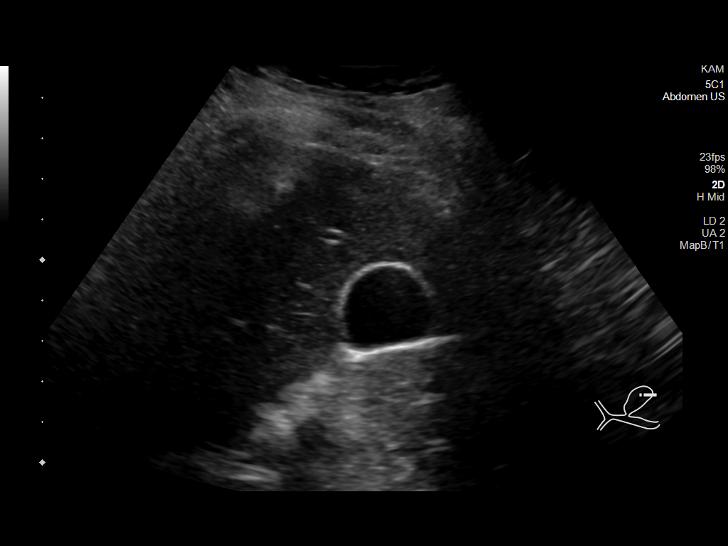
[im 18/69]
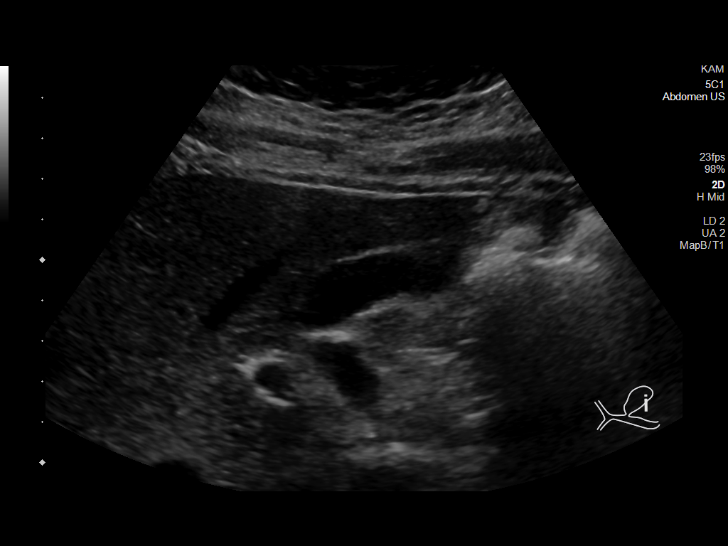
[im 23/69]
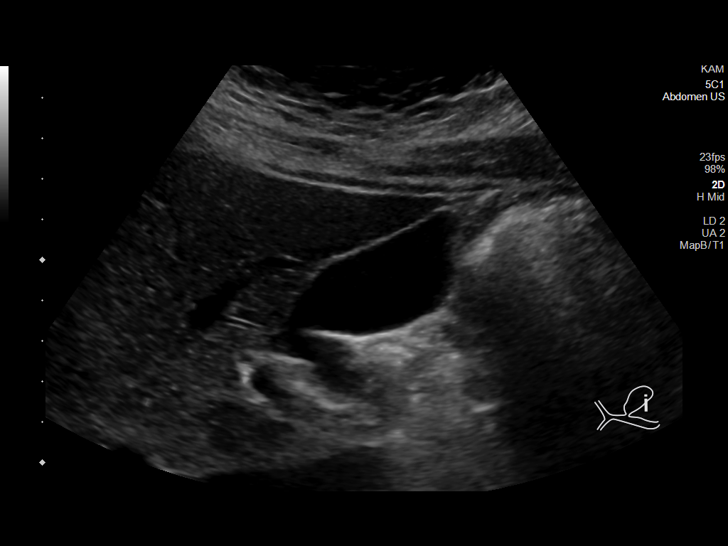
[im 26/69]
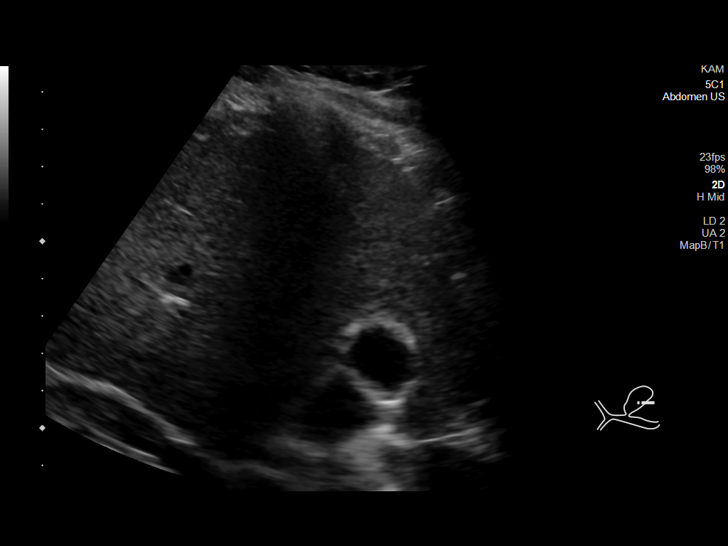
[im 32/69]
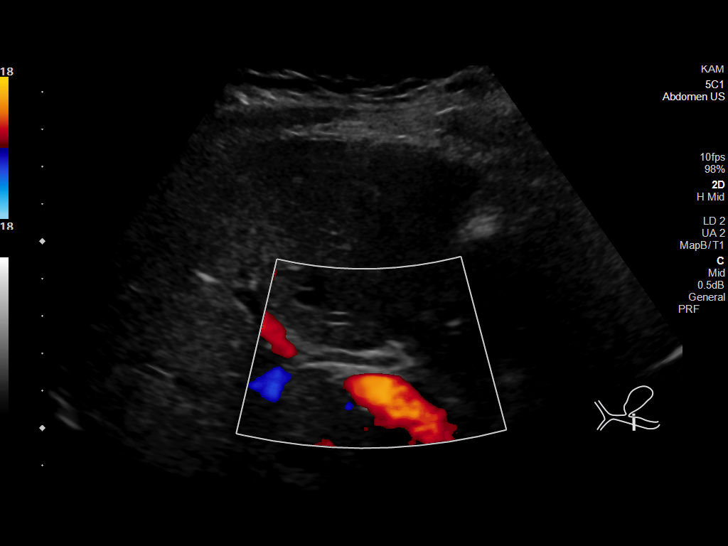
[im 37/69]
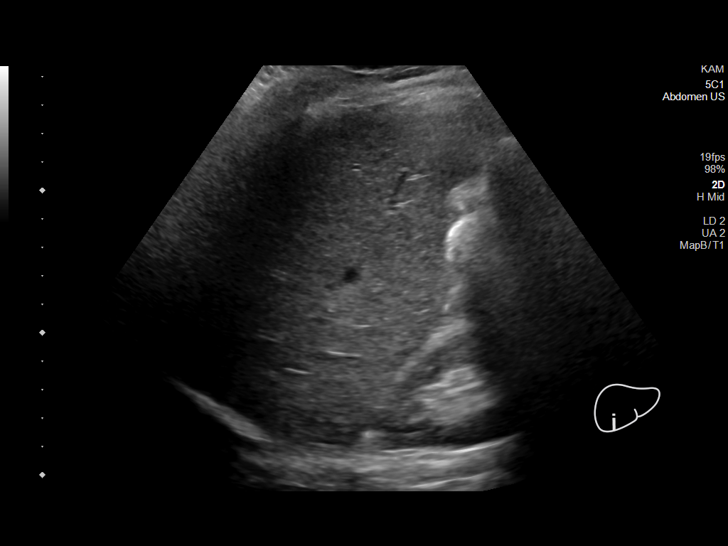
[im 43/69]
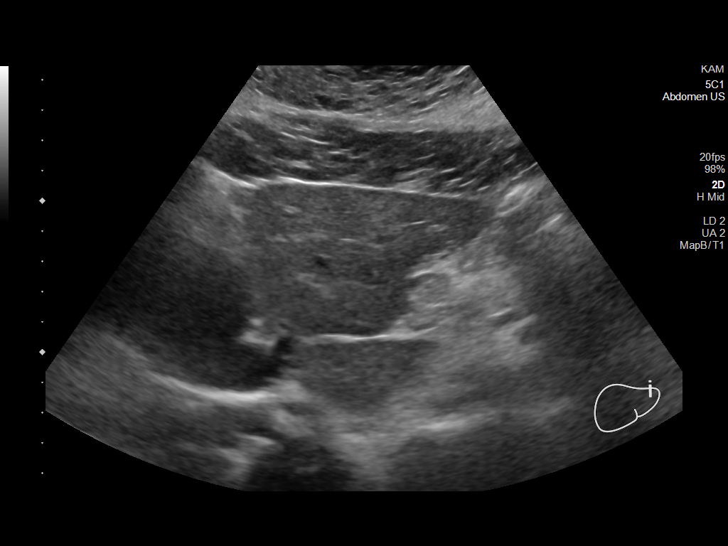
[im 46/69]
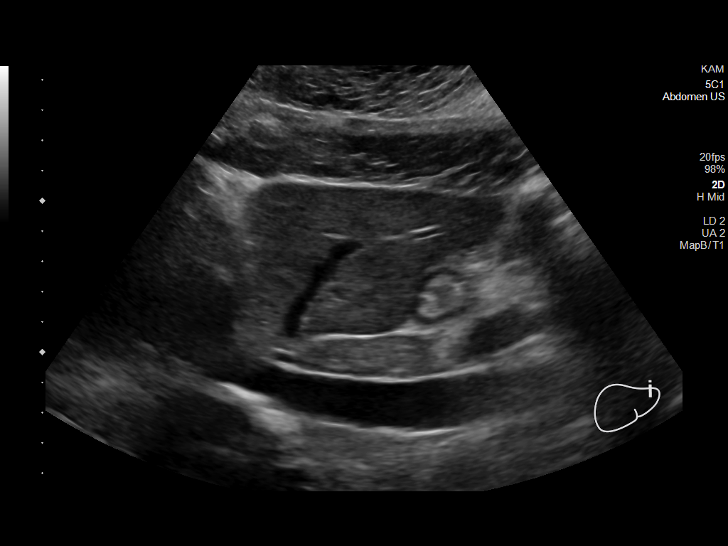
[im 52/69]
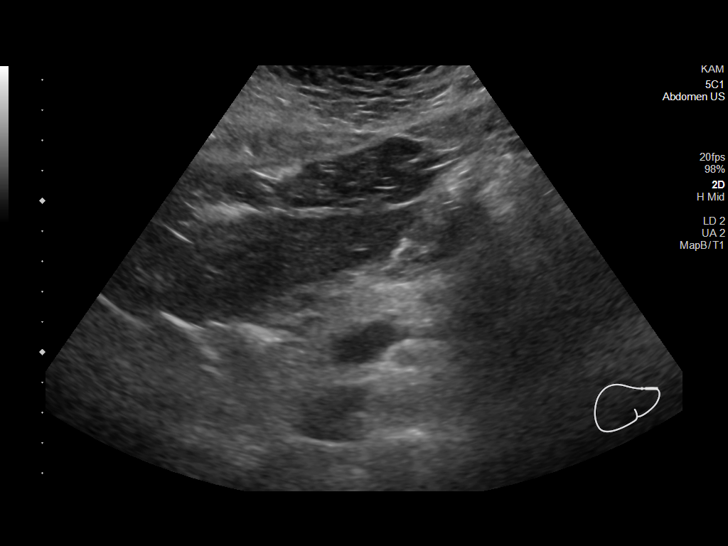
[im 57/69]
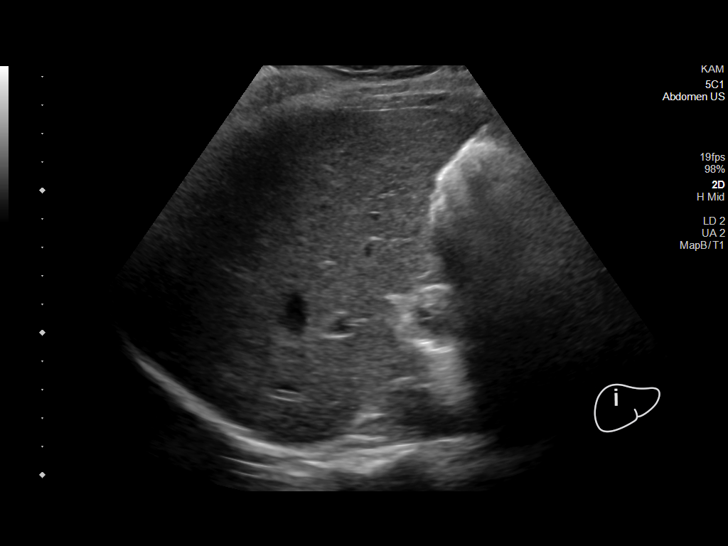
[im 63/69]
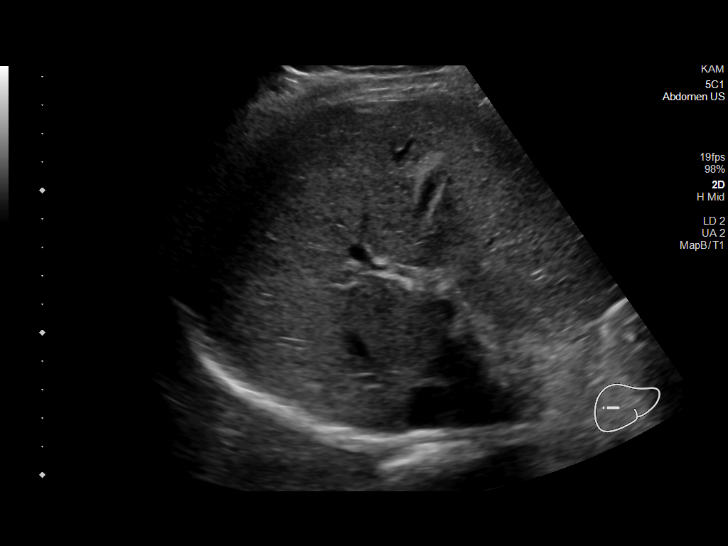
[im 69/69]
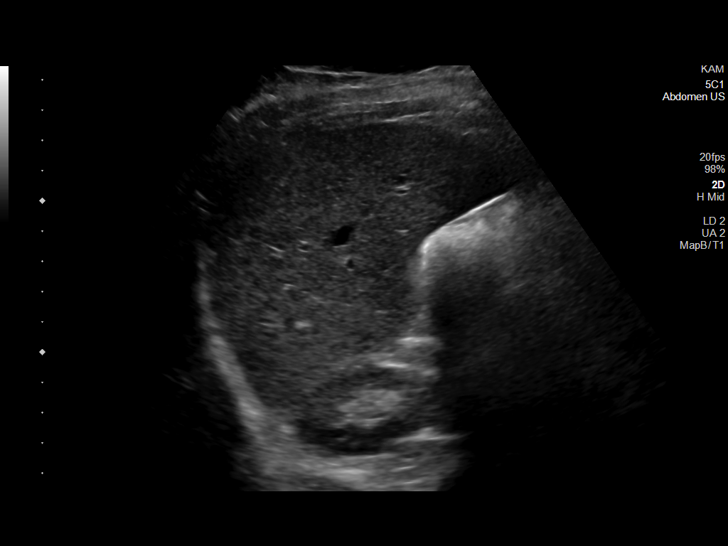

[14 of 25 positions shown; findings below may reference images not displayed]

FINDINGS: Gallbladder:

No gallstones or wall thickening visualized (1.8 mm). No sonographic
Murphy sign noted by sonographer.

Common bile duct:

Diameter: 3.1 mm

Liver:

No focal lesion identified. Within normal limits in parenchymal
echogenicity. Portal vein is patent on color Doppler imaging with
normal direction of blood flow towards the liver.

Other: None.
IMPRESSION: Normal right upper quadrant ultrasound.

## 2022-05-03 ENCOUNTER — Inpatient Hospital Stay (HOSPITAL_COMMUNITY)
Admission: AD | Admit: 2022-05-03 | Payer: BC Managed Care – PPO | Source: Home / Self Care | Admitting: Obstetrics and Gynecology

## 2022-05-20 DIAGNOSIS — Z419 Encounter for procedure for purposes other than remedying health state, unspecified: Secondary | ICD-10-CM | POA: Diagnosis not present

## 2022-05-24 ENCOUNTER — Ambulatory Visit: Payer: BC Managed Care – PPO | Admitting: Nurse Practitioner

## 2022-05-24 ENCOUNTER — Encounter: Payer: Self-pay | Admitting: Nurse Practitioner

## 2022-05-24 VITALS — BP 106/80 | HR 71 | Temp 98.3°F | Ht 63.0 in | Wt 196.6 lb

## 2022-05-24 DIAGNOSIS — Z6834 Body mass index (BMI) 34.0-34.9, adult: Secondary | ICD-10-CM

## 2022-05-24 DIAGNOSIS — Z2821 Immunization not carried out because of patient refusal: Secondary | ICD-10-CM | POA: Diagnosis not present

## 2022-05-24 DIAGNOSIS — E6609 Other obesity due to excess calories: Secondary | ICD-10-CM | POA: Diagnosis not present

## 2022-05-24 DIAGNOSIS — F900 Attention-deficit hyperactivity disorder, predominantly inattentive type: Secondary | ICD-10-CM | POA: Diagnosis not present

## 2022-05-24 MED ORDER — AMPHETAMINE-DEXTROAMPHETAMINE 5 MG PO TABS: 5.0000 mg | ORAL_TABLET | Freq: Every day | ORAL | 0 refills | Status: DC

## 2022-05-24 NOTE — Progress Notes (Signed)
I,Victoria T Hamilton,acting as a Neurosurgeon for Arnette Felts, FNP.,have documented all relevant documentation on the behalf of Arnette Felts, FNP,as directed by  Arnette Felts, FNP while in the presence of Arnette Felts, FNP.    Subjective:     Patient ID: Lisa Duncan , female    DOB: 09-22-1995 , 26 y.o.   MRN: [id number]   Chief Complaint  Patient presents with   ADHD    HPI  Patient presents today for adhd f/u. Wanting to resume medication, before having her baby she was having difficulty with focus. Was having difficulty at work, was unable to close out on any work. Now since having her baby who is 5 weeks. She is lactating.   Last patient took Adderall 20MG ,  December 10th 2022. Same time she found out she was pregnant.   She adds, recently having her first baby on 04/16/2022.  She is out of work until the end of October.      Past Medical History:  Diagnosis Date   ADHD    Anxiety    Dysarthria    after having Covid-19 in January 2020   Headache      Family History  Problem Relation Age of Onset   Vision loss Mother    Healthy Mother    Myasthenia gravis Father        20+ years ago   Diabetes Maternal Grandmother    Heart disease Maternal Grandmother    Heart disease Maternal Grandfather    Breast cancer Paternal Grandmother        Age 27's   Heart disease Paternal Grandfather      Current Outpatient Medications:    amphetamine-dextroamphetamine (ADDERALL) 5 MG tablet, Take 1 tablet (5 mg total) by mouth daily., Disp: 30 tablet, Rfl: 0   Prenatal Vit-Fe Fumarate-FA (PRENATAL MULTIVITAMIN) TABS tablet, Take 1 tablet by mouth daily at 12 noon., Disp: , Rfl:    Allergies  Allergen Reactions   Codeine Nausea And Vomiting   Hydrocodone     Altered mental status, extreme anxiety   Mushroom Extract Complex Other (See Comments)    Mouth soreness.   Penicillins Nausea And Vomiting and Rash     Review of Systems  Constitutional: Negative.   Respiratory:  Negative.    Cardiovascular: Negative.   Neurological: Negative.   Psychiatric/Behavioral: Negative.       Today's Vitals   05/24/22 1505  BP: 106/80  Pulse: 71  Temp: 98.3 F (36.8 C)  SpO2: 98%  Weight: 196 lb 9.6 oz (89.2 kg)  Height: 5\' 3"  (1.6 m)  PainSc: 0-No pain   Body mass index is 34.83 kg/m.  Wt Readings from Last 3 Encounters:  05/24/22 196 lb 9.6 oz (89.2 kg)  04/16/22 213 lb (96.6 kg)  04/13/22 211 lb (95.7 kg)    Objective:  Physical Exam Vitals reviewed.  Constitutional:      General: She is not in acute distress.    Appearance: Normal appearance. She is well-developed. She is obese.     Comments: 6 weeks postpartum  HENT:     Head: Normocephalic and atraumatic.  Eyes:     Pupils: Pupils are equal, round, and reactive to light.  Cardiovascular:     Rate and Rhythm: Normal rate and regular rhythm.     Pulses: Normal pulses.     Heart sounds: Normal heart sounds. No murmur heard. Pulmonary:     Effort: Pulmonary effort is normal. No respiratory distress.  Breath sounds: Normal breath sounds. No wheezing.  Musculoskeletal:        General: Normal range of motion.  Skin:    General: Skin is warm and dry.     Capillary Refill: Capillary refill takes less than 2 seconds.  Neurological:     General: No focal deficit present.     Mental Status: She is alert and oriented to person, place, and time.     Cranial Nerves: No cranial nerve deficit.     Motor: No weakness.  Psychiatric:        Mood and Affect: Mood normal.        Behavior: Behavior normal.        Thought Content: Thought content normal.        Judgment: Judgment normal.         Assessment And Plan:     1. ADHD (attention deficit hyperactivity disorder), inattentive type Comments: She is to start in 2 weeks at a low dose of Adderall after review on guidelines, advised this can potentially cause the baby to be more awake. If not effective will increase by 5 mg increments to 20 mg.  Encouraged to allow at least 2 weeks for effects. - amphetamine-dextroamphetamine (ADDERALL) 5 MG tablet; Take 1 tablet (5 mg total) by mouth daily.  Dispense: 30 tablet; Refill: 0  2. Mother currently breast-feeding  3. Class 1 obesity due to excess calories without serious comorbidity with body mass index (BMI) of 34.0 to 34.9 in adult She is encouraged to strive for BMI less than 30 to decrease cardiac risk. Advised to aim for at least 150 minutes of exercise per week.    Patient was given opportunity to ask questions. Patient verbalized understanding of the plan and was able to repeat key elements of the plan. All questions were answered to their satisfaction.  Arnette Felts, FNP   I, Arnette Felts, FNP, have reviewed all documentation for this visit. The documentation on 05/24/22 for the exam, diagnosis, procedures, and orders are all accurate and complete.   IF YOU HAVE BEEN REFERRED TO A SPECIALIST, IT MAY TAKE 1-2 WEEKS TO SCHEDULE/PROCESS THE REFERRAL. IF YOU HAVE NOT HEARD FROM US/SPECIALIST IN TWO WEEKS, PLEASE GIVE [place] A CALL AT [phone].   THE PATIENT IS ENCOURAGED TO PRACTICE SOCIAL DISTANCING DUE TO THE COVID-19 PANDEMIC.

## 2022-05-24 NOTE — Patient Instructions (Signed)
Attention Deficit Hyperactivity Disorder, Adult Attention deficit hyperactivity disorder (ADHD) is a mental health disorder that starts during childhood. For many people with ADHD, the disorder continues into the adult years. Treatment can help you manage your symptoms. There are three main types of ADHD: Inattentive. With this type, adults have difficulty paying attention. This may affect cognitive abilities. Hyperactive-impulsive. With this type, adults have a lot of energy and have difficulty controlling their behavior. Combination type. Some people may have symptoms of both types. What are the causes? The exact cause of ADHD is not known. Most experts believe a person's genes and environment possibly contribute to ADHD. What increases the risk? The following factors may make you more likely to develop this condition: Having a first-degree relative such as a parent, brother, or sister, with the condition. Being born before 37 weeks of pregnancy (prematurely) or at a low birth weight. Being born to a mother who smoked tobacco or drank alcohol during pregnancy. Having experienced a brain injury. Being exposed to lead or other toxins in the womb or early in life. What are the signs or symptoms? Symptoms of this condition depend on the type of ADHD. Symptoms of the inattentive type include: Difficulty paying attention or following instructions. Often making simple mistakes. Being disorganized. Avoiding tasks that require time and attention. Losing and forgetting things. Symptoms of the hyperactive-impulsive type include: Restlessness. Talking out of turn, interrupting others, or talking too much. Difficulty with: Sitting still. Feeling motivated. Relaxing. Waiting in line or waiting for a turn. People with the combination type have symptoms of both of the other types. In adults, this condition may lead to certain problems, such as: Keeping jobs. Performing tasks at work. Having  stable relationships. Being on time or keeping to a schedule. How is this diagnosed? This condition is diagnosed based on your current symptoms and your history of symptoms. The diagnosis can be made by a health care provider such as a primary care provider or a mental health care specialist. Your health care provider may use a symptom checklist or a behavior rating scale to evaluate your symptoms. Your health care provider may also want to talk with people who have observed your behaviors throughout your life. How is this treated? This condition can be treated with medicines and behavior therapy. Medicines may be the best option to reduce impulsive behaviors and improve attention. Your health care provider may recommend: Stimulant medicines. These are the most common medicines used for adult ADHD. They affect certain chemicals in the brain (neurotransmitters) and improve your ability to control your symptoms. A non-stimulant medicine. These medicines can also improve focus, attention, and impulsive behavior. It may take weeks to months to see the effects of this medicine. Counseling and behavioral management are also important for treating ADHD. Counseling is often used along with medicine. Your health care provider may suggest: Cognitive behavioral therapy (CBT). This type of therapy teaches you to replace negative thoughts and actions with positive thoughts and actions. When used as part of ADHD treatment, this therapy may also include: Coping strategies for organization, time management, impulse control, and stress reduction. Mindfulness and meditation training. Behavioral management. You may work with a coach who is specially trained to help people with ADHD manage and organize activities and function more effectively. Follow these instructions at home: Medicines  Take over-the-counter and prescription medicines only as told by your health care provider. Talk with your health care provider  about the possible side effects of your medicines and   how to manage them. Alcohol use Do not drink alcohol if: Your health care provider tells you not to drink. You are pregnant, may be pregnant, or are planning to become pregnant. If you drink alcohol: Limit how much you use to: 0-1 drink a day for women. 0-2 drinks a day for men. Know how much alcohol is in your drink. In the [place] one drink equals one 12 oz bottle of beer (355 mL), one 5 oz glass of wine (148 mL), or one 1 oz glass of hard liquor (44 mL). Lifestyle  Do not use illegal drugs. Get enough sleep. Eat a healthy diet. Exercise regularly. Exercise can help to reduce stress and anxiety. General instructions Learn as much as you can about adult ADHD, and work closely with your health care providers to find the treatments that work best for you. Follow the same schedule each day. Use reminder devices like notes, calendars, and phone apps to stay on time and organized. Keep all follow-up visits. Your health care provider will need to monitor your condition and adjust your treatment over time. Where to find more information A health care provider may be able to recommend resources that are available online or over the phone. You could start with: Attention Deficit Disorder Association (ADDA): add.org National Institute of Mental Health (NIMH): nimh.nih.gov Contact a health care provider if: Your symptoms continue to cause problems. You have side effects from your medicine, such as: Repeated muscle twitches, coughing, or speech outbursts. Sleep problems. Loss of appetite. Dizziness. Unusually fast heartbeat. Stomach pains. Headaches. You are struggling with anxiety, depression, or substance abuse. Get help right away if: You have a severe reaction to a medicine. This symptom may be an emergency. Get help right away. Call 911. Do not wait to see if the symptom will go away. Do not drive yourself to the hospital. Take  one of these steps if you feel like you may hurt yourself or others, or have thoughts about taking your own life: Go to your nearest emergency room. Call 911. Call the National Suicide Prevention Lifeline at 1-800-273-8255 or 988. This is open 24 hours a day Text the Crisis Text Line at 741741. Summary ADHD is a mental health disorder that starts during childhood and often continues into your adult years. The exact cause of ADHD is not known. Most experts believe genetics and environmental factors contribute to ADHD. There is no cure for ADHD, but treatment with medicine, cognitive behavioral therapy, or behavioral management can help you manage your condition. This information is not intended to replace advice given to you by your health care provider. Make sure you discuss any questions you have with your health care provider. Document Revised: 12/24/2021 Document Reviewed: 12/24/2021 Elsevier Patient Education  2023 Elsevier Inc.  

## 2022-05-31 DIAGNOSIS — Z1389 Encounter for screening for other disorder: Secondary | ICD-10-CM | POA: Diagnosis not present

## 2022-06-19 DIAGNOSIS — Z419 Encounter for procedure for purposes other than remedying health state, unspecified: Secondary | ICD-10-CM | POA: Diagnosis not present

## 2022-07-20 DIAGNOSIS — Z419 Encounter for procedure for purposes other than remedying health state, unspecified: Secondary | ICD-10-CM | POA: Diagnosis not present

## 2022-07-28 ENCOUNTER — Encounter: Payer: BC Managed Care – PPO | Admitting: Nurse Practitioner

## 2022-08-19 DIAGNOSIS — Z419 Encounter for procedure for purposes other than remedying health state, unspecified: Secondary | ICD-10-CM | POA: Diagnosis not present

## 2022-09-19 DIAGNOSIS — Z419 Encounter for procedure for purposes other than remedying health state, unspecified: Secondary | ICD-10-CM | POA: Diagnosis not present

## 2022-10-20 DIAGNOSIS — Z419 Encounter for procedure for purposes other than remedying health state, unspecified: Secondary | ICD-10-CM | POA: Diagnosis not present

## 2022-11-18 DIAGNOSIS — Z419 Encounter for procedure for purposes other than remedying health state, unspecified: Secondary | ICD-10-CM | POA: Diagnosis not present

## 2022-11-22 ENCOUNTER — Ambulatory Visit: Payer: BC Managed Care – PPO | Admitting: Nurse Practitioner

## 2022-12-19 DIAGNOSIS — Z419 Encounter for procedure for purposes other than remedying health state, unspecified: Secondary | ICD-10-CM | POA: Diagnosis not present

## 2023-01-11 DIAGNOSIS — R635 Abnormal weight gain: Secondary | ICD-10-CM | POA: Diagnosis not present

## 2023-01-18 DIAGNOSIS — Z419 Encounter for procedure for purposes other than remedying health state, unspecified: Secondary | ICD-10-CM | POA: Diagnosis not present

## 2023-02-18 DIAGNOSIS — Z419 Encounter for procedure for purposes other than remedying health state, unspecified: Secondary | ICD-10-CM | POA: Diagnosis not present

## 2023-03-20 DIAGNOSIS — Z419 Encounter for procedure for purposes other than remedying health state, unspecified: Secondary | ICD-10-CM | POA: Diagnosis not present

## 2023-04-20 DIAGNOSIS — Z419 Encounter for procedure for purposes other than remedying health state, unspecified: Secondary | ICD-10-CM | POA: Diagnosis not present

## 2023-05-21 DIAGNOSIS — Z419 Encounter for procedure for purposes other than remedying health state, unspecified: Secondary | ICD-10-CM | POA: Diagnosis not present

## 2023-06-20 DIAGNOSIS — Z419 Encounter for procedure for purposes other than remedying health state, unspecified: Secondary | ICD-10-CM | POA: Diagnosis not present

## 2023-07-21 DIAGNOSIS — Z419 Encounter for procedure for purposes other than remedying health state, unspecified: Secondary | ICD-10-CM | POA: Diagnosis not present

## 2023-08-20 DIAGNOSIS — Z419 Encounter for procedure for purposes other than remedying health state, unspecified: Secondary | ICD-10-CM | POA: Diagnosis not present

## 2023-09-20 DIAGNOSIS — Z419 Encounter for procedure for purposes other than remedying health state, unspecified: Secondary | ICD-10-CM | POA: Diagnosis not present

## 2023-10-01 DIAGNOSIS — Z419 Encounter for procedure for purposes other than remedying health state, unspecified: Secondary | ICD-10-CM | POA: Diagnosis not present

## 2023-10-18 DIAGNOSIS — R635 Abnormal weight gain: Secondary | ICD-10-CM | POA: Diagnosis not present

## 2023-10-19 LAB — HM PAP SMEAR

## 2023-10-21 DIAGNOSIS — Z419 Encounter for procedure for purposes other than remedying health state, unspecified: Secondary | ICD-10-CM | POA: Diagnosis not present

## 2023-10-30 ENCOUNTER — Ambulatory Visit (INDEPENDENT_AMBULATORY_CARE_PROVIDER_SITE_OTHER): Payer: 59 | Admitting: Nurse Practitioner

## 2023-10-30 ENCOUNTER — Encounter: Payer: Self-pay | Admitting: Nurse Practitioner

## 2023-10-30 VITALS — BP 120/70 | HR 71 | Temp 98.5°F | Wt 233.4 lb

## 2023-10-30 DIAGNOSIS — R1012 Left upper quadrant pain: Secondary | ICD-10-CM | POA: Diagnosis not present

## 2023-10-30 DIAGNOSIS — R7989 Other specified abnormal findings of blood chemistry: Secondary | ICD-10-CM | POA: Diagnosis not present

## 2023-10-30 DIAGNOSIS — R635 Abnormal weight gain: Secondary | ICD-10-CM

## 2023-10-30 DIAGNOSIS — R6884 Jaw pain: Secondary | ICD-10-CM

## 2023-10-30 DIAGNOSIS — Z2821 Immunization not carried out because of patient refusal: Secondary | ICD-10-CM | POA: Diagnosis not present

## 2023-10-30 DIAGNOSIS — Z6841 Body Mass Index (BMI) 40.0 and over, adult: Secondary | ICD-10-CM | POA: Diagnosis not present

## 2023-10-30 DIAGNOSIS — F418 Other specified anxiety disorders: Secondary | ICD-10-CM

## 2023-10-30 DIAGNOSIS — J029 Acute pharyngitis, unspecified: Secondary | ICD-10-CM | POA: Diagnosis not present

## 2023-10-30 LAB — POCT RAPID STREP A (OFFICE): Rapid Strep A Screen: NEGATIVE

## 2023-10-30 MED ORDER — HYDROXYZINE HCL 10 MG PO TABS: 10.0000 mg | ORAL_TABLET | Freq: Three times a day (TID) | ORAL | 2 refills | Status: DC | PRN

## 2023-10-30 NOTE — Assessment & Plan Note (Signed)
 Her ALT increased to 102 at her GYN office. Will recheck levels and hepatitis panel. Will also check a CT abdomen to evaluate for any liver issues. She has pain to left upper abdomen.

## 2023-10-30 NOTE — Assessment & Plan Note (Addendum)
 Erythema to posterior oropharynx, encouraged to gargle with warm salt water. She is to also take an over the counter antihistamine. Negative rapid strep

## 2023-10-30 NOTE — Assessment & Plan Note (Signed)
 Tenderness to left upper abdomen. Will check CT abdomen

## 2023-10-30 NOTE — Progress Notes (Addendum)
Madelaine Bhat, CMA,acting as a Neurosurgeon for Arnette Felts, FNP.,have documented all relevant documentation on the behalf of Arnette Felts, FNP,as directed by  Arnette Felts, FNP while in the presence of Arnette Felts, FNP.  Subjective:  Patient ID: Lisa Duncan , female    DOB: 07/21/1996 , 28 y.o.   MRN: [id number]  No chief complaint on file.   HPI  Patient presents today for her liver enzymes being high, patient reports she got blood work at her OBGYN and they told her to contact her PCP. [facility]. Patient reports her liver functions level have progressively gotten worse. She does not drink any alcohol.  She is taking an vitamin d supplement over the counter 5,000 units. Patient reports her last blood work was January 2025.  She reports she has been nausea in the morning and midafternoon for the last several months. She thought was related to her Junel and is now on loestral.   She was taking sertraline for anxiety and noticed her enzymes were elevated so has not started.   Patient reports she has left sided jaw pain and pain in her left tonsil. Patient reports that first started last Wednesday. She has been using warm salt water gargles and no fever.        Past Medical History:  Diagnosis Date   ADHD    Anxiety    Dysarthria    after having Covid-19 in January 2020   Headache      Family History  Problem Relation Age of Onset   Vision loss Mother    Healthy Mother    Myasthenia gravis Father        20+ years ago   Diabetes Maternal Grandmother    Heart disease Maternal Grandmother    Heart disease Maternal Grandfather    Breast cancer Paternal Grandmother        Age 47's   Heart disease Paternal Grandfather      Current Outpatient Medications:    hydrOXYzine (ATARAX) 10 MG tablet, Take 1 tablet (10 mg total) by mouth 3 (three) times daily as needed., Disp: 30 tablet, Rfl: 2   Allergies  Allergen Reactions   Codeine Nausea And  Vomiting   Hydrocodone     Altered mental status, extreme anxiety   Mushroom Extract Complex (Obsolete) Other (See Comments)    Mouth soreness.   Amoxicillin Nausea And Vomiting and Rash   Penicillins Nausea And Vomiting and Rash     Review of Systems  Constitutional:  Positive for fatigue.  Cardiovascular: Negative.   Gastrointestinal:  Positive for abdominal pain (left abdomen pain) and nausea. Negative for abdominal distention and vomiting.  Musculoskeletal: Negative.   Skin: Negative.      Today's Vitals   10/30/23 0839  BP: 120/70  Pulse: 71  Temp: 98.5 F (36.9 C)  TempSrc: Oral  Weight: 233 lb 6.4 oz (105.9 kg)  PainSc: 7   PainLoc: Jaw   Body mass index is 41.34 kg/m.  Wt Readings from Last 3 Encounters:  10/30/23 233 lb 6.4 oz (105.9 kg)  05/24/22 196 lb 9.6 oz (89.2 kg)  04/16/22 213 lb (96.6 kg)    Objective:  Physical Exam Vitals reviewed.  Constitutional:      General: She is not in acute distress.    Appearance: Normal appearance. She is well-developed. She is obese.  HENT:     Head: Normocephalic and atraumatic.     Jaw: There is normal jaw  occlusion. No tenderness.     Right Ear: External ear normal. There is no impacted cerumen. Tympanic membrane is bulging.     Left Ear: External ear normal. There is no impacted cerumen. Tympanic membrane is bulging.     Ears:     Comments: She has tenderness near her preauricular area    Mouth/Throat:     Pharynx: Posterior oropharyngeal erythema present.  Eyes:     Pupils: Pupils are equal, round, and reactive to light.  Cardiovascular:     Rate and Rhythm: Normal rate and regular rhythm.     Pulses: Normal pulses.     Heart sounds: Normal heart sounds. No murmur heard. Pulmonary:     Effort: Pulmonary effort is normal. No respiratory distress.     Breath sounds: Normal breath sounds. No wheezing.  Musculoskeletal:        General: Normal range of motion.  Skin:    General: Skin is warm and dry.      Capillary Refill: Capillary refill takes less than 2 seconds.  Neurological:     General: No focal deficit present.     Mental Status: She is alert and oriented to person, place, and time.     Cranial Nerves: No cranial nerve deficit.     Motor: No weakness.  Psychiatric:        Mood and Affect: Mood normal.        Behavior: Behavior normal.        Thought Content: Thought content normal.        Judgment: Judgment normal.         Assessment And Plan:  COVID-19 vaccination declined Assessment & Plan: Declines covid 19 vaccine. Discussed risk of covid 6 and if she changes her mind about the vaccine to call the office. Education has been provided regarding the importance of this vaccine but patient still declined. Advised may receive this vaccine at local pharmacy or Health Dept.or vaccine clinic. Aware to provide a copy of the vaccination record if obtained from local pharmacy or Health Dept.  Encouraged to take multivitamin, vitamin d, vitamin c and zinc to increase immune system. Aware can call office if would like to have vaccine here at office. Verbalized acceptance and understanding.     Elevated liver function tests Assessment & Plan: Her ALT increased to 102 at her GYN office. Will recheck levels and hepatitis panel. Will also check a CT abdomen to evaluate for any liver issues. She has pain to left upper abdomen.   Orders: -     Hepatitis Panel (REFL) -     Lipase -     Amylase -     Acute Hep Panel & Hep B Surface Ab  Left upper quadrant abdominal pain Assessment & Plan: Tenderness to left upper abdomen. Will check CT abdomen   Orders: -     CMP14+EGFR -     CT ABDOMEN PELVIS W CONTRAST; Future -     Beta hCG quant (ref lab) -     Lipase -     Amylase  Jaw pain  Sore throat Assessment & Plan: Erythema to posterior oropharynx, encouraged to gargle with warm salt water. She is to also take an over the counter antihistamine. Negative rapid strep  Orders: -      POCT rapid strep A  Situational anxiety Assessment & Plan: Will treat with hydroxyzine since this is situational. She is advised to not take when driving or operating heavy machinery.  Orders: -     hydrOXYzine HCl; Take 1 tablet (10 mg total) by mouth 3 (three) times daily as needed.  Dispense: 30 tablet; Refill: 2  Weight gain Assessment & Plan: She has had approximately 20 lb weight gain over the last year per patient.   Orders: -     Thyroid peroxidase antibody  BMI 40.0-44.9, adult (HCC)  Influenza vaccination declined Assessment & Plan: Patient declined influenza vaccination at this time. Patient is aware that influenza vaccine prevents illness in 70% of healthy people, and reduces hospitalizations to 30-70% in elderly. This vaccine is recommended annually. Education has been provided regarding the importance of this vaccine but patient still declined. Advised may receive this vaccine at local pharmacy or Health Dept.or vaccine clinic. Aware to provide a copy of the vaccination record if obtained from local pharmacy or Health Dept.  Pt is willing to accept risk associated with refusing vaccination.      Return in about 5 months (around 03/28/2024) for phy when able.  Patient was given opportunity to ask questions. Patient verbalized understanding of the plan and was able to repeat key elements of the plan. All questions were answered to their satisfaction.    Jeanell Sparrow, FNP, have reviewed all documentation for this visit. The documentation on 10/31/23 for the exam, diagnosis, procedures, and orders are all accurate and complete.   IF YOU HAVE BEEN REFERRED TO A SPECIALIST, IT MAY TAKE 1-2 WEEKS TO SCHEDULE/PROCESS THE REFERRAL. IF YOU HAVE NOT HEARD FROM US/SPECIALIST IN TWO WEEKS, PLEASE GIVE [place] A CALL AT [phone].

## 2023-10-30 NOTE — Patient Instructions (Signed)
 You can take over the counter antihistamine daily You can do warm salt water gargles for your throat pain If not better by Thursday call to the office

## 2023-10-30 NOTE — Assessment & Plan Note (Signed)

## 2023-10-30 NOTE — Assessment & Plan Note (Signed)

## 2023-10-30 NOTE — Assessment & Plan Note (Signed)
 She is encouraged to strive for BMI less than 30 to decrease cardiac risk. Advised to aim for at least 150 minutes of exercise per week.

## 2023-10-30 NOTE — Assessment & Plan Note (Signed)
 She has had approximately 20 lb weight gain over the last year per patient.

## 2023-10-30 NOTE — Assessment & Plan Note (Signed)
 Will treat with hydroxyzine  since this is situational. She is advised to not take when driving or operating heavy machinery.

## 2023-10-31 ENCOUNTER — Encounter: Payer: Self-pay | Admitting: Nurse Practitioner

## 2023-10-31 LAB — CMP14+EGFR
ALT: 69 [IU]/L — ABNORMAL HIGH (ref 0–32)
AST: 53 [IU]/L — ABNORMAL HIGH (ref 0–40)
Albumin: 4.5 g/dL (ref 4.0–5.0)
Alkaline Phosphatase: 149 [IU]/L — ABNORMAL HIGH (ref 44–121)
BUN/Creatinine Ratio: 13 (ref 9–23)
BUN: 10 mg/dL (ref 6–20)
Bilirubin Total: 0.7 mg/dL (ref 0.0–1.2)
CO2: 20 mmol/L (ref 20–29)
Calcium: 9.3 mg/dL (ref 8.7–10.2)
Chloride: 101 mmol/L (ref 96–106)
Creatinine, Ser: 0.79 mg/dL (ref 0.57–1.00)
Globulin, Total: 2.7 g/dL (ref 1.5–4.5)
Glucose: 83 mg/dL (ref 70–99)
Potassium: 4.4 mmol/L (ref 3.5–5.2)
Sodium: 136 mmol/L (ref 134–144)
Total Protein: 7.2 g/dL (ref 6.0–8.5)
eGFR: 106 mL/min/{1.73_m2} (ref >59)

## 2023-10-31 LAB — ACUTE HEP PANEL AND HEP B SURFACE AB
Hep A IgM: NEGATIVE
Hep B C IgM: NEGATIVE
Hep C Virus Ab: NONREACTIVE
Hepatitis B Surf Ab Quant: 106 m[IU]/mL
Hepatitis B Surface Ag: NEGATIVE

## 2023-10-31 LAB — THYROID PEROXIDASE ANTIBODY: Thyroperoxidase Ab SerPl-aCnc: 39 [IU]/mL — ABNORMAL HIGH (ref 0–34)

## 2023-10-31 LAB — BETA HCG QUANT (REF LAB): hCG Quant: <1 m[IU]/mL

## 2023-10-31 LAB — AMYLASE: Amylase: 68 U/L (ref 31–110)

## 2023-10-31 LAB — LIPASE: Lipase: 23 U/L (ref 14–72)

## 2023-10-31 NOTE — Code Documentation (Signed)
Done

## 2023-11-01 ENCOUNTER — Encounter: Payer: Self-pay | Admitting: Nurse Practitioner

## 2023-11-03 ENCOUNTER — Ambulatory Visit
Admission: RE | Admit: 2023-11-03 | Discharge: 2023-11-03 | Disposition: A | Discharge status: Home / Self Care | Payer: Medicaid Other | Source: Ambulatory Visit | Attending: Nurse Practitioner | Admitting: Nurse Practitioner

## 2023-11-03 DIAGNOSIS — R1012 Left upper quadrant pain: Secondary | ICD-10-CM

## 2023-11-03 DIAGNOSIS — K760 Fatty (change of) liver, not elsewhere classified: Secondary | ICD-10-CM | POA: Diagnosis not present

## 2023-11-03 MED ORDER — IOPAMIDOL (ISOVUE-300) INJECTION 61%
500.0000 mL | Freq: Once | INTRAVENOUS | Status: AC | PRN
  Administered 2023-11-03: 100 mL via INTRAVENOUS

## 2023-11-05 ENCOUNTER — Encounter: Payer: Self-pay | Admitting: Nurse Practitioner

## 2023-11-09 ENCOUNTER — Ambulatory Visit: Payer: Medicaid Other | Admitting: Nurse Practitioner

## 2023-11-18 DIAGNOSIS — Z419 Encounter for procedure for purposes other than remedying health state, unspecified: Secondary | ICD-10-CM | POA: Diagnosis not present

## 2023-11-22 ENCOUNTER — Encounter: Payer: Self-pay | Admitting: Nurse Practitioner

## 2023-11-23 ENCOUNTER — Other Ambulatory Visit: Payer: Self-pay | Admitting: Nurse Practitioner

## 2023-11-23 DIAGNOSIS — Z6841 Body Mass Index (BMI) 40.0 and over, adult: Secondary | ICD-10-CM

## 2023-11-23 DIAGNOSIS — K760 Fatty (change of) liver, not elsewhere classified: Secondary | ICD-10-CM

## 2023-11-23 MED ORDER — WEGOVY 0.25 MG/0.5ML ~~LOC~~ SOAJ: 0.2500 mg | SUBCUTANEOUS | 0 refills | Status: DC

## 2023-11-30 ENCOUNTER — Telehealth: Payer: Self-pay

## 2023-11-30 NOTE — Telephone Encounter (Signed)
 PA sent to plan. [email or web address]

## 2023-12-04 ENCOUNTER — Telehealth: Payer: Self-pay

## 2023-12-04 NOTE — Telephone Encounter (Signed)
 [facility] approved 11/30/2023-05/28/2024 Ticket #[id number]

## 2023-12-05 ENCOUNTER — Ambulatory Visit: Payer: Self-pay

## 2023-12-05 VITALS — BP 112/80 | HR 74 | Temp 98.4°F | Ht 63.0 in | Wt 233.0 lb

## 2023-12-05 DIAGNOSIS — Z6841 Body Mass Index (BMI) 40.0 and over, adult: Secondary | ICD-10-CM

## 2023-12-05 NOTE — Patient Instructions (Signed)

## 2023-12-05 NOTE — Progress Notes (Signed)
 Patient presents today for [facility] teaching. First dose administered.  Patient verbalized understanding and did not have any questions. YL,RMA

## 2023-12-28 ENCOUNTER — Other Ambulatory Visit: Payer: Self-pay | Admitting: Nurse Practitioner

## 2023-12-28 DIAGNOSIS — Z6841 Body Mass Index (BMI) 40.0 and over, adult: Secondary | ICD-10-CM

## 2023-12-28 DIAGNOSIS — K760 Fatty (change of) liver, not elsewhere classified: Secondary | ICD-10-CM

## 2023-12-30 DIAGNOSIS — Z419 Encounter for procedure for purposes other than remedying health state, unspecified: Secondary | ICD-10-CM | POA: Diagnosis not present

## 2024-01-18 ENCOUNTER — Ambulatory Visit (INDEPENDENT_AMBULATORY_CARE_PROVIDER_SITE_OTHER): Payer: Self-pay | Admitting: Nurse Practitioner

## 2024-01-18 ENCOUNTER — Encounter: Payer: Self-pay | Admitting: Nurse Practitioner

## 2024-01-18 VITALS — BP 120/80 | HR 72 | Temp 98.5°F | Ht 63.0 in | Wt 228.6 lb

## 2024-01-18 DIAGNOSIS — K760 Fatty (change of) liver, not elsewhere classified: Secondary | ICD-10-CM | POA: Diagnosis not present

## 2024-01-18 DIAGNOSIS — E66813 Obesity, class 3: Secondary | ICD-10-CM | POA: Diagnosis not present

## 2024-01-18 DIAGNOSIS — R946 Abnormal results of thyroid function studies: Secondary | ICD-10-CM

## 2024-01-18 DIAGNOSIS — R748 Abnormal levels of other serum enzymes: Secondary | ICD-10-CM | POA: Insufficient documentation

## 2024-01-18 DIAGNOSIS — Z6841 Body Mass Index (BMI) 40.0 and over, adult: Secondary | ICD-10-CM

## 2024-01-18 DIAGNOSIS — Z2821 Immunization not carried out because of patient refusal: Secondary | ICD-10-CM

## 2024-01-18 MED ORDER — WEGOVY 1 MG/0.5ML ~~LOC~~ SOAJ: 1.0000 mg | SUBCUTANEOUS | 1 refills | Status: DC

## 2024-01-18 MED ORDER — WEGOVY 0.5 MG/0.5ML ~~LOC~~ SOAJ: 0.5000 mg | SUBCUTANEOUS | 0 refills | Status: DC

## 2024-01-18 NOTE — Assessment & Plan Note (Addendum)
 She has lost approximately 4 lbs since starting Wegovy . She can tell a difference with her clothes fitting. Will increase to 0.5 mg weekly. Continue exercising regularly. RTO in 2 months for weight check.

## 2024-01-18 NOTE — Progress Notes (Signed)
 [place], CMA,acting as a Neurosurgeon for Susanna Epley, FNP.,have documented all relevant documentation on the behalf of Susanna Epley, FNP,as directed by  Susanna Epley, FNP while in the presence of Susanna Epley, FNP.  Subjective:  Patient ID: Lisa Duncan , female    DOB: March 04, 1996 , 27 y.o.   MRN: [id number]  Chief Complaint  Patient presents with   Obesity    Patient presents today for a weight follow up, Patient reports compliance with medication. Patient denies any chest pain, SOB, or headaches. Patient has no concerns today. Patient reports she is having no issues with wegovy  and it is going really good she would like to increase to 0.5mg .    HPI  She "feels really good" she is eating 6 small meals a day. She is eating her heavier meals earlier in the day. She is exercising daily with a walk with her son, she will do strength training. She drinks about 40 oz - 3 to 4 a day.      Past Medical History:  Diagnosis Date   ADHD    Anxiety    Dysarthria    after having Covid-19 in January 2020   Headache      Family History  Problem Relation Age of Onset   Vision loss Mother    Healthy Mother    Myasthenia gravis Father        20+ years ago   Diabetes Maternal Grandmother    Heart disease Maternal Grandmother    Heart disease Maternal Grandfather    Breast cancer Paternal Grandmother        Age 40's   Heart disease Paternal Grandfather      Current Outpatient Medications:    hydrOXYzine  (ATARAX ) 10 MG tablet, Take 1 tablet (10 mg total) by mouth 3 (three) times daily as needed., Disp: 30 tablet, Rfl: 2   Semaglutide -Weight Management (WEGOVY ) 0.5 MG/0.5ML SOAJ, Inject 0.5 mg into the skin once a week., Disp: 2 mL, Rfl: 0   Allergies  Allergen Reactions   Codeine Nausea And Vomiting   Hydrocodone     Altered mental status, extreme anxiety   Mushroom Extract Complex (Obsolete) Other (See Comments)    Mouth soreness.   Amoxicillin Nausea And Vomiting and Rash    Penicillins Nausea And Vomiting and Rash     Review of Systems  Constitutional:  Negative for fatigue.  Cardiovascular: Negative.   Gastrointestinal:  Positive for nausea. Negative for abdominal distention, abdominal pain and vomiting.  Musculoskeletal: Negative.   Skin: Negative.      Today's Vitals   01/18/24 1049  BP: 120/80  Pulse: 72  Temp: 98.5 F (36.9 C)  TempSrc: Oral  Weight: 228 lb 9.6 oz (103.7 kg)  Height: 5\' 3"  (1.6 m)  PainSc: 0-No pain   Body mass index is 40.49 kg/m.  Wt Readings from Last 3 Encounters:  01/18/24 228 lb 9.6 oz (103.7 kg)  12/05/23 233 lb (105.7 kg)  10/30/23 233 lb 6.4 oz (105.9 kg)    Objective:  Physical Exam Vitals and nursing note reviewed.  Constitutional:      General: She is not in acute distress.    Appearance: Normal appearance. She is well-developed. She is obese.  Cardiovascular:     Rate and Rhythm: Normal rate and regular rhythm.     Pulses: Normal pulses.     Heart sounds: Normal heart sounds. No murmur heard. Pulmonary:     Effort: Pulmonary effort is normal. No respiratory  distress.     Breath sounds: Normal breath sounds.  Chest:     Chest wall: No tenderness.  Musculoskeletal:        General: Normal range of motion.  Skin:    General: Skin is warm and dry.     Capillary Refill: Capillary refill takes less than 2 seconds.  Neurological:     General: No focal deficit present.     Mental Status: She is alert and oriented to person, place, and time.     Cranial Nerves: No cranial nerve deficit.  Psychiatric:        Mood and Affect: Mood normal.        Behavior: Behavior normal.        Thought Content: Thought content normal.        Judgment: Judgment normal.     Assessment And Plan:  COVID-19 vaccination declined Assessment & Plan: Declines covid 19 vaccine. Discussed risk of covid 87 and if she changes her mind about the vaccine to call the office. Education has been provided regarding the importance of  this vaccine but patient still declined. Advised may receive this vaccine at local pharmacy or Health Dept.or vaccine clinic. Aware to provide a copy of the vaccination record if obtained from local pharmacy or Health Dept.  Encouraged to take multivitamin, vitamin d, vitamin c and zinc to increase immune system. Aware can call office if would like to have vaccine here at office. Verbalized acceptance and understanding.     Hepatic steatosis Assessment & Plan: Encouraged to cut back on fried and fatty foods. Continue focusing on losing weight. If she starts to have pain take tylenol  as needed. She is allergic to codeine so we did not do tramadol.    Elevated liver enzymes Assessment & Plan: Will recheck today. She had a CT abdomen which showed steatosis.   Orders: -     CMP14+EGFR  Abnormal thyroid  function test Assessment & Plan: Slightly elevated thyroid  peroxidase. Will recheck levels  Orders: -     Thyroid  peroxidase antibody  Class 3 severe obesity due to excess calories without serious comorbidity with body mass index (BMI) of 40.0 to 44.9 in adult [facility]) Assessment & Plan: She has lost approximately 4 lbs since starting Wegovy . She can tell a difference with her clothes fitting. Will increase to 0.5 mg weekly. Continue exercising regularly. RTO in 2 months for weight check.  Orders: -     Wegovy ; Inject 0.5 mg into the skin once a week.  Dispense: 2 mL; Refill: 0    Return for keep same next.  Patient was given opportunity to ask questions. Patient verbalized understanding of the plan and was able to repeat key elements of the plan. All questions were answered to their satisfaction.    Inge Mangle, FNP, have reviewed all documentation for this visit. The documentation on 01/18/24 for the exam, diagnosis, procedures, and orders are all accurate and complete.   IF YOU HAVE BEEN REFERRED TO A SPECIALIST, IT MAY TAKE 1-2 WEEKS TO SCHEDULE/PROCESS THE REFERRAL. IF YOU HAVE  NOT HEARD FROM US /SPECIALIST IN TWO WEEKS, PLEASE GIVE US  A CALL AT [phone].

## 2024-01-18 NOTE — Assessment & Plan Note (Addendum)
 Encouraged to cut back on fried and fatty foods. Continue focusing on losing weight. If she starts to have pain take tylenol  as needed. She is allergic to codeine so we did not do tramadol.

## 2024-01-18 NOTE — Assessment & Plan Note (Signed)
 Slightly elevated thyroid  peroxidase. Will recheck levels

## 2024-01-18 NOTE — Assessment & Plan Note (Signed)

## 2024-01-18 NOTE — Assessment & Plan Note (Signed)
 Will recheck today. She had a CT abdomen which showed steatosis.

## 2024-01-19 LAB — CMP14+EGFR
ALT: 53 IU/L — ABNORMAL HIGH (ref 0–32)
AST: 42 IU/L — ABNORMAL HIGH (ref 0–40)
Albumin: 4.4 g/dL (ref 4.0–5.0)
Alkaline Phosphatase: 126 IU/L — ABNORMAL HIGH (ref 44–121)
BUN/Creatinine Ratio: 11 (ref 9–23)
BUN: 10 mg/dL (ref 6–20)
Bilirubin Total: 0.7 mg/dL (ref 0.0–1.2)
CO2: 20 mmol/L (ref 20–29)
Calcium: 9.8 mg/dL (ref 8.7–10.2)
Chloride: 102 mmol/L (ref 96–106)
Creatinine, Ser: 0.93 mg/dL (ref 0.57–1.00)
Globulin, Total: 2.8 g/dL (ref 1.5–4.5)
Glucose: 80 mg/dL (ref 70–99)
Potassium: 4.8 mmol/L (ref 3.5–5.2)
Sodium: 136 mmol/L (ref 134–144)
Total Protein: 7.2 g/dL (ref 6.0–8.5)
eGFR: 86 mL/min/{1.73_m2} (ref >59)

## 2024-01-19 LAB — THYROID PEROXIDASE ANTIBODY: Thyroperoxidase Ab SerPl-aCnc: 55 [IU]/mL — ABNORMAL HIGH (ref 0–34)

## 2024-01-29 DIAGNOSIS — Z419 Encounter for procedure for purposes other than remedying health state, unspecified: Secondary | ICD-10-CM | POA: Diagnosis not present

## 2024-02-23 ENCOUNTER — Other Ambulatory Visit: Payer: Self-pay | Admitting: Nurse Practitioner

## 2024-02-23 DIAGNOSIS — Z6841 Body Mass Index (BMI) 40.0 and over, adult: Secondary | ICD-10-CM

## 2024-02-25 ENCOUNTER — Encounter: Payer: Self-pay | Admitting: Nurse Practitioner

## 2024-02-28 ENCOUNTER — Encounter: Payer: Self-pay | Admitting: Nurse Practitioner

## 2024-02-28 ENCOUNTER — Other Ambulatory Visit: Payer: Self-pay | Admitting: Nurse Practitioner

## 2024-02-28 DIAGNOSIS — Z6841 Body Mass Index (BMI) 40.0 and over, adult: Secondary | ICD-10-CM

## 2024-02-28 MED ORDER — WEGOVY 1 MG/0.5ML ~~LOC~~ SOAJ: 1.0000 mg | SUBCUTANEOUS | 2 refills | Status: DC

## 2024-02-29 DIAGNOSIS — Z419 Encounter for procedure for purposes other than remedying health state, unspecified: Secondary | ICD-10-CM | POA: Diagnosis not present

## 2024-03-28 ENCOUNTER — Encounter: Payer: Medicaid Other | Admitting: Nurse Practitioner

## 2024-03-30 DIAGNOSIS — Z419 Encounter for procedure for purposes other than remedying health state, unspecified: Secondary | ICD-10-CM | POA: Diagnosis not present

## 2024-04-10 ENCOUNTER — Ambulatory Visit (INDEPENDENT_AMBULATORY_CARE_PROVIDER_SITE_OTHER): Admitting: Nurse Practitioner

## 2024-04-10 ENCOUNTER — Encounter: Payer: Self-pay | Admitting: Nurse Practitioner

## 2024-04-10 VITALS — BP 120/60 | HR 80 | Temp 98.5°F | Ht 63.0 in | Wt 220.0 lb

## 2024-04-10 DIAGNOSIS — R946 Abnormal results of thyroid function studies: Secondary | ICD-10-CM

## 2024-04-10 DIAGNOSIS — E6609 Other obesity due to excess calories: Secondary | ICD-10-CM

## 2024-04-10 DIAGNOSIS — F418 Other specified anxiety disorders: Secondary | ICD-10-CM

## 2024-04-10 DIAGNOSIS — E66812 Obesity, class 2: Secondary | ICD-10-CM | POA: Diagnosis not present

## 2024-04-10 DIAGNOSIS — N926 Irregular menstruation, unspecified: Secondary | ICD-10-CM | POA: Diagnosis not present

## 2024-04-10 DIAGNOSIS — F900 Attention-deficit hyperactivity disorder, predominantly inattentive type: Secondary | ICD-10-CM | POA: Diagnosis not present

## 2024-04-10 DIAGNOSIS — Z2821 Immunization not carried out because of patient refusal: Secondary | ICD-10-CM

## 2024-04-10 DIAGNOSIS — M5442 Lumbago with sciatica, left side: Secondary | ICD-10-CM | POA: Diagnosis not present

## 2024-04-10 DIAGNOSIS — Z13220 Encounter for screening for lipoid disorders: Secondary | ICD-10-CM

## 2024-04-10 DIAGNOSIS — Z136 Encounter for screening for cardiovascular disorders: Secondary | ICD-10-CM

## 2024-04-10 DIAGNOSIS — Z0000 Encounter for general adult medical examination without abnormal findings: Secondary | ICD-10-CM | POA: Diagnosis not present

## 2024-04-10 DIAGNOSIS — Z6838 Body mass index (BMI) 38.0-38.9, adult: Secondary | ICD-10-CM | POA: Diagnosis not present

## 2024-04-10 DIAGNOSIS — Z79899 Other long term (current) drug therapy: Secondary | ICD-10-CM

## 2024-04-10 MED ORDER — TRIAMCINOLONE ACETONIDE 40 MG/ML IJ SUSP: 60.0000 mg | Freq: Once | INTRAMUSCULAR | Status: DC

## 2024-04-10 MED ORDER — LO LOESTRIN FE 1 MG-10 MCG / 10 MCG PO TABS: 1.0000 | ORAL_TABLET | Freq: Every day | ORAL | Status: AC

## 2024-04-10 MED ORDER — WEGOVY 1.7 MG/0.75ML ~~LOC~~ SOAJ: 1.7000 mg | SUBCUTANEOUS | 1 refills | Status: DC

## 2024-04-10 MED ORDER — KETOROLAC TROMETHAMINE 60 MG/2ML IM SOLN
60.0000 mg | Freq: Once | INTRAMUSCULAR | Status: AC
  Administered 2024-04-10: 60 mg via INTRAMUSCULAR

## 2024-04-10 NOTE — Progress Notes (Signed)
 xsI,Ciera JINNY Lunger, CMA,acting as a scribe for Gaines Ada, FNP.,have documented all relevant documentation on the behalf of Gaines Ada, FNP,as directed by  Gaines Ada, FNP while in the presence of Gaines Ada, FNP.  Subjective:    Patient ID: Lisa Duncan , female    DOB: 1995/11/04 , 28 y.o.   MRN: [id number]  Chief Complaint  Patient presents with   Annual Exam    Patient presents today for HM, Patient reports compliance with medication. Patient denies any chest pain, SOB, or headaches.    Back Pain    Patient reports her lower left side of her back is hurting, she reports she was in a snowboarding accident in 2022, she reports she recently had to chase her dog and tackle him this past weekend and since she has been lower back pain that travels down her legs. She describes it as feeling like a pinched nerve.    Late Period    Patient reports she is on her off week of her birth control that started this past weekend and has not yet had her cycle she reports it is usually on time.     HPI  HPI  Discussed the use of AI scribe software for clinical note transcription with the patient, who gave verbal consent to proceed.  History of Present Illness Lisa Duncan is a 28 year old female who presents for an annual physical exam and evaluation of left lower back pain.  She experiences left lower back pain radiating down her leg, alternating between sharp shooting pain and a burning sensation. The pain has worsened compared to previous episodes. She has difficulty lifting her left leg while walking, leading to overcompensation. The pain began after chasing and tackling her dog on Sunday. Over-the-counter medications like Advil and Tylenol have not provided relief. She has a history of a previous back injury and has seen a chiropractor for manipulations and realignments in the past.  She is currently taking Wegovy and has lost 13 pounds since March. Her diet includes high protein,  greens, berries, and smoothies, and she exercises at least four days a week. She has a two-year-old child and reports not being able to lift her toddler due to the back pain.  She has experienced anxiety attacks and has previously taken hydroxyzine. She tried clonazepam, which her brother provided during an anxiety attack, and found it effective within ten minutes. Her anxiety is triggered by overstimulation, particularly in large family gatherings.  Her last menstrual cycle was on June 20th and she is currently on Loestrin for birth control, which she has been taking for at least a year without missing any doses. Her menstrual cycle is regular, and she reports that when she stops taking the pill or misses a dose, her period comes right away.   Past Medical History:  Diagnosis Date   ADHD    Allergy    Pine shavings, penicillins   Anxiety    Dysarthria    after having Covid-19 in January 2020   Headache      Family History  Problem Relation Age of Onset   Vision loss Mother    Anxiety disorder Mother    Depression Mother    Myasthenia gravis Father        20 + years ago   Diabetes Maternal Grandmother    Heart disease Maternal Grandmother    Cancer Maternal Grandmother    Depression Maternal Grandmother    Heart disease Maternal Grandfather  Breast cancer Paternal Grandmother        Age 2's   Cancer Paternal Grandmother    Heart disease Paternal Grandfather    Cancer Paternal Grandfather    ADD / ADHD Brother    Depression Brother    Anxiety disorder Sister    Cancer Maternal Aunt    Cancer Paternal Uncle    Depression Sister      Current Outpatient Medications:    Norethindrone -Ethinyl Estradiol -Fe Biphas (LO LOESTRIN FE ) 1 MG-10 MCG / 10 MCG tablet, Take 1 tablet by mouth daily., Disp: , Rfl:    Semaglutide -Weight Management (WEGOVY ) 1.7 MG/0.75ML SOAJ, Inject 1.7 mg into the skin once a week., Disp: 3 mL, Rfl: 1   hydrOXYzine  (ATARAX ) 10 MG tablet, Take 1 tablet  (10 mg total) by mouth 3 (three) times daily as needed., Disp: 30 tablet, Rfl: 2   predniSONE  (DELTASONE ) 20 MG tablet, Take 2 tablets (40 mg total) by mouth daily with breakfast., Disp: 10 tablet, Rfl: 0   triamcinolone  cream (KENALOG ) 0.1 %, Apply 1 Application topically 2 (two) times daily., Disp: 30 g, Rfl: 0   Allergies  Allergen Reactions   Codeine Nausea And Vomiting   Hydrocodone     Altered mental status, extreme anxiety   Mushroom Extract Complex (Obsolete) Other (See Comments)    Mouth soreness.   Amoxicillin Nausea And Vomiting and Rash   Penicillins Nausea And Vomiting and Rash      The patient states she uses OCP (estrogen/progesterone) for birth control. Patient's last menstrual period was 03/08/2024.. Negative for Dysmenorrhea and Negative for Menorrhagia. Negative for: breast discharge, breast lump(s), breast pain and breast self exam. Associated symptoms include abnormal vaginal bleeding. Pertinent negatives include abnormal bleeding (hematology), anxiety, decreased libido, depression, difficulty falling sleep, dyspareunia, history of infertility, nocturia, sexual dysfunction, sleep disturbances, urinary incontinence, urinary urgency, vaginal discharge and vaginal itching. Diet regular; eats lots of protein, greens, she has cut out some of the [place] she was eating. The patient states her exercise level is moderate with exercising at least 4 days a week.    The patient's tobacco use is:  Social History   Tobacco Use  Smoking Status Never  Smokeless Tobacco Never   She has been exposed to passive smoke. The patient's alcohol use is:  Social History   Substance and Sexual Activity  Alcohol Use Not Currently   Comment: SOCIAL    Additional information: Last pap 10/19/2023, next one scheduled for 10/18/26.    Review of Systems  Constitutional: Negative.   HENT: Negative.    Eyes: Negative.   Respiratory: Negative.    Cardiovascular: Negative.   Gastrointestinal:  Negative.   Endocrine: Negative.   Genitourinary: Negative.   Musculoskeletal: Negative.   Skin: Negative.   Allergic/Immunologic: Negative.   Neurological: Negative.   Hematological: Negative.   Psychiatric/Behavioral: Negative.       Today's Vitals   04/10/24 1020 04/10/24 1143  BP: 120/60   Pulse: 80   Temp: 98.5 F (36.9 C)   TempSrc: Oral   Weight: 220 lb (99.8 kg)   Height: 5' 3 (1.6 m)   PainSc: 10-Worst pain ever 10-Worst pain ever  PainLoc: Back    Body mass index is 38.97 kg/m.  Wt Readings from Last 3 Encounters:  04/10/24 220 lb (99.8 kg)  01/18/24 228 lb 9.6 oz (103.7 kg)  12/05/23 233 lb (105.7 kg)     Objective:  Physical Exam Vitals and nursing note reviewed.  Constitutional:  General: She is not in acute distress.    Appearance: Normal appearance. She is well-developed. She is obese.  HENT:     Head: Normocephalic and atraumatic.     Right Ear: Hearing, tympanic membrane, ear canal and external ear normal. There is no impacted cerumen.     Left Ear: Hearing, tympanic membrane, ear canal and external ear normal. There is no impacted cerumen.     Nose: Nose normal.     Mouth/Throat:     Mouth: Mucous membranes are moist.  Eyes:     General: Lids are normal.     Extraocular Movements: Extraocular movements intact.     Conjunctiva/sclera: Conjunctivae normal.     Pupils: Pupils are equal, round, and reactive to light.     Funduscopic exam:    Right eye: No papilledema.        Left eye: No papilledema.  Neck:     Thyroid : No thyroid  mass.     Vascular: No carotid bruit.  Cardiovascular:     Rate and Rhythm: Normal rate and regular rhythm.     Pulses: Normal pulses.     Heart sounds: Normal heart sounds. No murmur heard. Pulmonary:     Effort: Pulmonary effort is normal. No respiratory distress.     Breath sounds: Normal breath sounds. No wheezing.  Chest:     Chest wall: No mass.  Breasts:    Tanner Score is 5.     Right: Normal. No  mass or tenderness.     Left: Normal. No mass or tenderness.  Abdominal:     General: Abdomen is flat. Bowel sounds are normal. There is no distension.     Palpations: Abdomen is soft.     Tenderness: There is no abdominal tenderness.  Genitourinary:    Rectum: Guaiac result negative.  Musculoskeletal:        General: No swelling. Normal range of motion.     Cervical back: Full passive range of motion without pain, normal range of motion and neck supple.     Right lower leg: No edema.     Left lower leg: No edema.  Lymphadenopathy:     Upper Body:     Right upper body: No supraclavicular, axillary or pectoral adenopathy.     Left upper body: No supraclavicular, axillary or pectoral adenopathy.  Skin:    General: Skin is warm and dry.     Capillary Refill: Capillary refill takes less than 2 seconds.  Neurological:     General: No focal deficit present.     Mental Status: She is alert and oriented to person, place, and time.     Cranial Nerves: No cranial nerve deficit.     Sensory: No sensory deficit.     Motor: No weakness.  Psychiatric:        Mood and Affect: Mood normal.        Behavior: Behavior normal.        Thought Content: Thought content normal.        Judgment: Judgment normal.         Assessment And Plan:     Encounter for annual health examination Assessment & Plan: Weight management with Wegovy  successful, 13-pound loss. Adheres to high-protein, low-carb diet and regular exercise. Pap smear current, declined COVID-19 vaccine. - Continue current diet and exercise regimen. - Schedule next Pap smear for 2028. - Respect decision to decline COVID-19 vaccination.   ADHD (attention deficit hyperactivity disorder), inattentive type  Abnormal thyroid   function test Assessment & Plan: Slightly elevated thyroid  peroxidase. Will recheck levels  Orders: -     TSH + free T4  Situational anxiety Assessment & Plan: Anxiety attacks in overstimulating environments.  Clonazepam effective for acute relief, but controlled nature requires limited use. - Prescribe limited supply of clonazepam for acute anxiety attacks. - Discuss need for controlled substance agreement before prescribing clonazepam. - Consider alternative long-term anxiety management strategies if clonazepam is needed frequently.  Orders: -     CMP14+EGFR  COVID-19 vaccination declined Assessment & Plan: Declines covid 19 vaccine. Discussed risk of covid 51 and if she changes her mind about the vaccine to call the office. Education has been provided regarding the importance of this vaccine but patient still declined. Advised may receive this vaccine at local pharmacy or Health Dept.or vaccine clinic. Aware to provide a copy of the vaccination record if obtained from local pharmacy or Health Dept.  Encouraged to take multivitamin, vitamin d, vitamin c and zinc to increase immune system. Aware can call office if would like to have vaccine here at office. Verbalized acceptance and understanding.     Late menstruation Assessment & Plan: Will check beta hcg. Has had negative home pregnancy test.   Orders: -     Beta hCG quant (ref lab)  Class 2 obesity due to excess calories with body mass index (BMI) of 38.0 to 38.9 in adult, unspecified whether serious comorbidity present Assessment & Plan: She is encouraged to strive for BMI less than 30 to decrease cardiac risk. Advised to aim for at least 150 minutes of exercise per week. Continue Wegovy  doing well with weight loss, recent loss of 13 lbs.   Orders: -     Hemoglobin A1c  Other long term (current) drug therapy -     CBC with Differential/Platelet  Encounter for lipid screening for cardiovascular disease -     Lipid panel  Acute left-sided low back pain with left-sided sciatica Assessment & Plan: Severe left lower back pain with radiculopathy, likely due to sciatic nerve inflammation or injury. No relief with Advil  and Tylenol . -  Administer Kenalog  injection. - Advise to contact clinic if symptoms improve but are not fully resolved for steroid taper consideration. - Order blood test to rule out pregnancy before continuing Wegovy .  Orders: -     Ketorolac  Tromethamine   Other orders -     Wegovy ; Inject 1.7 mg into the skin once a week.  Dispense: 3 mL; Refill: 1 -     Lo Loestrin Fe ; Take 1 tablet by mouth daily.      Return for 2 months weight check, 1 year physical. Patient was given opportunity to ask questions. Patient verbalized understanding of the plan and was able to repeat key elements of the plan. All questions were answered to their satisfaction.   Gaines Ada, FNP  I, Gaines Ada, FNP, have reviewed all documentation for this visit. The documentation on 04/10/24 for the exam, diagnosis, procedures, and orders are all accurate and complete.

## 2024-04-11 LAB — CBC WITH DIFFERENTIAL/PLATELET
Basophils Absolute: 0.1 x10E3/uL (ref 0.0–0.2)
Basos: 1 %
EOS (ABSOLUTE): 0.1 x10E3/uL (ref 0.0–0.4)
Eos: 1 %
Hematocrit: 39.7 % (ref 34.0–46.6)
Hemoglobin: 13.3 g/dL (ref 11.1–15.9)
Immature Grans (Abs): 0 x10E3/uL (ref 0.0–0.1)
Immature Granulocytes: 0 %
Lymphocytes Absolute: 2.6 x10E3/uL (ref 0.7–3.1)
Lymphs: 33 %
MCH: 29.4 pg (ref 26.6–33.0)
MCHC: 33.5 g/dL (ref 31.5–35.7)
MCV: 88 fL (ref 79–97)
Monocytes Absolute: 0.4 x10E3/uL (ref 0.1–0.9)
Monocytes: 6 %
Neutrophils Absolute: 4.5 x10E3/uL (ref 1.4–7.0)
Neutrophils: 59 %
Platelets: 320 x10E3/uL (ref 150–450)
RBC: 4.52 x10E6/uL (ref 3.77–5.28)
RDW: 12.7 % (ref 11.7–15.4)
WBC: 7.7 x10E3/uL (ref 3.4–10.8)

## 2024-04-11 LAB — CMP14+EGFR
ALT: 92 IU/L — ABNORMAL HIGH (ref 0–32)
AST: 68 IU/L — ABNORMAL HIGH (ref 0–40)
Albumin: 4.5 g/dL (ref 4.0–5.0)
Alkaline Phosphatase: 116 IU/L (ref 44–121)
BUN/Creatinine Ratio: 11 (ref 9–23)
BUN: 9 mg/dL (ref 6–20)
Bilirubin Total: 0.6 mg/dL (ref 0.0–1.2)
CO2: 19 mmol/L — ABNORMAL LOW (ref 20–29)
Calcium: 9.5 mg/dL (ref 8.7–10.2)
Chloride: 100 mmol/L (ref 96–106)
Creatinine, Ser: 0.82 mg/dL (ref 0.57–1.00)
Globulin, Total: 2.6 g/dL (ref 1.5–4.5)
Glucose: 82 mg/dL (ref 70–99)
Potassium: 4.4 mmol/L (ref 3.5–5.2)
Sodium: 135 mmol/L (ref 134–144)
Total Protein: 7.1 g/dL (ref 6.0–8.5)
eGFR: 100 mL/min/1.73 (ref >59)

## 2024-04-11 LAB — BETA HCG QUANT (REF LAB): hCG Quant: <1 m[IU]/mL

## 2024-04-11 LAB — TSH+FREE T4
Free T4: 1.05 ng/dL (ref 0.82–1.77)
TSH: 1.62 u[IU]/mL (ref 0.450–4.500)

## 2024-04-11 LAB — LIPID PANEL
Chol/HDL Ratio: 4.6 ratio — ABNORMAL HIGH (ref 0.0–4.4)
Cholesterol, Total: 233 mg/dL — ABNORMAL HIGH (ref 100–199)
HDL: 51 mg/dL (ref >39)
LDL Chol Calc (NIH): 142 mg/dL — ABNORMAL HIGH (ref 0–99)
Triglycerides: 221 mg/dL — ABNORMAL HIGH (ref 0–149)
VLDL Cholesterol Cal: 40 mg/dL (ref 5–40)

## 2024-04-11 LAB — HEMOGLOBIN A1C
Est. average glucose Bld gHb Est-mCnc: 88 mg/dL
Hgb A1c MFr Bld: 4.7 % — ABNORMAL LOW (ref 4.8–5.6)

## 2024-04-15 ENCOUNTER — Telehealth: Admitting: Physician Assistant

## 2024-04-15 DIAGNOSIS — T63461A Toxic effect of venom of wasps, accidental (unintentional), initial encounter: Secondary | ICD-10-CM

## 2024-04-15 DIAGNOSIS — L299 Pruritus, unspecified: Secondary | ICD-10-CM

## 2024-04-15 DIAGNOSIS — L539 Erythematous condition, unspecified: Secondary | ICD-10-CM | POA: Diagnosis not present

## 2024-04-15 MED ORDER — PREDNISONE 20 MG PO TABS: 40.0000 mg | ORAL_TABLET | Freq: Every day | ORAL | 0 refills | Status: DC

## 2024-04-15 MED ORDER — TRIAMCINOLONE ACETONIDE 0.1 % EX CREA
1.0000 | TOPICAL_CREAM | Freq: Two times a day (BID) | CUTANEOUS | 0 refills | Status: AC
Start: 2024-04-15 — End: ?

## 2024-04-15 NOTE — Patient Instructions (Signed)
 Lisa Duncan, thank you for joining Delon CHRISTELLA Dickinson, PA-C for today's virtual visit.  While this provider is not your primary care provider (PCP), if your PCP is located in our provider database this encounter information will be shared with them immediately following your visit.   A [facility] MyChart account gives you access to today's visit and all your visits, tests, and labs performed at [facility]  click here if you don't have a [facility] MyChart account or go to [email or web address]  Consent: (Patient) Lisa Duncan provided verbal consent for this virtual visit at the beginning of the encounter.  Current Medications:  Current Outpatient Medications:    predniSONE  (DELTASONE ) 20 MG tablet, Take 2 tablets (40 mg total) by mouth daily with breakfast., Disp: 10 tablet, Rfl: 0   triamcinolone  cream (KENALOG ) 0.1 %, Apply 1 Application topically 2 (two) times daily., Disp: 30 g, Rfl: 0   hydrOXYzine  (ATARAX ) 10 MG tablet, Take 1 tablet (10 mg total) by mouth 3 (three) times daily as needed., Disp: 30 tablet, Rfl: 2   Norethindrone -Ethinyl Estradiol -Fe Biphas (LO LOESTRIN FE ) 1 MG-10 MCG / 10 MCG tablet, Take 1 tablet by mouth daily., Disp: , Rfl:    Semaglutide -Weight Management (WEGOVY ) 1.7 MG/0.75ML SOAJ, Inject 1.7 mg into the skin once a week., Disp: 3 mL, Rfl: 1   Medications ordered in this encounter:  Meds ordered this encounter  Medications   predniSONE  (DELTASONE ) 20 MG tablet    Sig: Take 2 tablets (40 mg total) by mouth daily with breakfast.    Dispense:  10 tablet    Refill:  0    Supervising Provider:   LAMPTEY, PHILIP O [[id number]]   triamcinolone  cream (KENALOG ) 0.1 %    Sig: Apply 1 Application topically 2 (two) times daily.    Dispense:  30 g    Refill:  0    Supervising Provider:   BLAISE ALEENE KIDD [[id number]]     *If you need refills on other medications prior to your next appointment, please contact your  pharmacy*  Follow-Up: Call back or seek an in-person evaluation if the symptoms worsen or if the condition fails to improve as anticipated.  [facility] [phone]  Other Instructions Bee, Wasp, or Hornet Sting, Adult Bees, wasps, and hornets are part of a family of insects that sting. Normally, a sting will cause pain, redness, and swelling at the sting site. However, some people have an allergy to these stings, and their reactions can be much more serious. What increases the risk? You may be at a greater risk of getting stung if you: Provoke a stinging insect by swatting or disturbing it. Wear strong-smelling soaps, deodorants, or body sprays. Spend time outdoors near gardens with flowers or fruit trees or in clothes that expose skin. Eat or drink outside. What are the signs or symptoms? The reaction to an insect sting can vary from a mild, normal response to life-threatening anaphylaxis. The sting site is often a red lump in the skin, sometimes with a tiny hole in the center, that may still have the stinger in the center of the wound. Normal reaction A normal reaction is experienced by most people after an insect sting. Symptoms include: Pain, redness, and swelling at the sting site. These can develop over 24-48 hours. Pain, redness, and swelling that may spread to a larger, connected area beyond the sting site. The spreading can continue over 24-48 hours. Allergic reaction An allergic reaction can vary  in severity and includes symptoms in other areas of the body beyond the sting site. People who experience an allergic reaction have a higher risk of having similar or worse symptoms the next time they are stung. Symptoms may include: Hives, itching, and swelling. Abdominal symptoms including cramping, nausea, vomiting, and diarrhea. Severe symptoms that require immediate medical attention include: Chest pain or tightness. Wheezing or trouble breathing. Swelling of the  tongue, throat, or lips. Trouble swallowing or hoarse voice. Anaphylactic reaction An anaphylactic reaction is a severe, life-threatening allergy and requires immediate medical attention. The symptoms often include severe allergic reaction symptoms that develop rapidly and lead to: A sudden and sharp drop in blood pressure. Dizziness. Loss of consciousness. How is this diagnosed? This condition is usually diagnosed based on your symptoms and medical history as well as a physical exam. You may have an allergy test to determine if you are allergic to the insect venom. How is this treated? If you were stung by a bee, the stinger and a small sac of venom may be in the wound. Remove the stinger as soon as possible. Do this by brushing across the wound with gauze, a clean fingernail, or a flat card such as a credit card. This can help reduce the severity of your body's reaction to the sting. Normal reactions can be treated with: Washing the area thoroughly with soap and water. Applying ice to the area to reduce swelling. Oral or topical medicines to help reduce pain and itching, if present. Pay close attention to your symptoms after you have been stung. If possible, have someone stay with you to see if an allergic reaction develops. If allergic symptoms develop, oral antihistamines can be taken and you will need medical help right away. If you had an allergic reaction before, you may need to: Use an auto-injector pen(pre-filled automatic epinephrine injection device)at the first sign of an allergic reaction. Get medical help right away because the epinephrine is short-acting. It is intended to give you more time to get to an emergency room. Follow these instructions at home:  Wash the sting site 2-3 times a day with soap and water as told by your health care provider. Apply or take over-the-counter and prescription medicines only as told by your health care provider. If directed, apply ice to the  sting area. Put ice in a plastic bag. Place a towel between your skin and the bag. Leave the ice on for 20 minutes, 2-3 times a day. Do not scratch the sting area. If you had a severe allergic reaction to a sting, you may need to: Wear a medical bracelet or necklace that lists the allergy. Carry an anaphylaxis kit or an epinephrine auto-injector pen with you at all times. Tell your family members, friends, and coworkers when and how to use it. Use it at the first sign of an allergic reaction. How is this prevented? Avoid swatting at stinging insects and disturbing insect nests. Do not use fragrant soaps or lotions and avoid sitting near flowering plants, if possible. Wear shoes, pants, and long sleeves when spending time outdoors, especially in grassy areas where stinging insects are common. Keep outdoor areas free from nests or hives. Keep food and drink containers covered when eating outdoors. Wear gloves if you are gardening or working outdoors. Find a barrier between you and the insect(s), such as a door, if an attack by a stinging insect or a swarm seems likely. Contact a health care provider if: Your symptoms do not  get better in 2-3 days. You have redness, swelling, or pain that spreads beyond the area of the sting. You have a fever. Get help right away if: You have symptoms of a severe allergic reaction. These include: Chest tightness or pain. Wheezing, or trouble swallowing or breathing. Light-headedness, dizziness, or fainting. Itchy, raised, red patches on the skin beyond the sting site. Abdominal cramping, nausea, vomiting, or diarrhea. Trouble swallowing or a swollen tongue, throat, or lips. These symptoms may be an emergency. Get help right away. Call 911. Do not wait to see if the symptoms will go away. Do not drive yourself to the hospital. Summary Stings from bees, wasps, and hornets can cause pain and swelling, but they are usually not serious. However, some people  may have an allergic reaction to a sting. This can cause the symptoms to be more severe. Pay close attention to your symptoms after you have been stung. If possible, have someone stay with you to look for signs of worsening symptoms. Call your health care provider if you have any signs of an allergic reaction. This information is not intended to replace advice given to you by your health care provider. Make sure you discuss any questions you have with your health care provider. Document Revised: 11/02/2021 Document Reviewed: 11/02/2021 Elsevier Patient Education  2024 [employer]   If you have been instructed to have an in-person evaluation today at a local Urgent Care facility, please use the link below. It will take you to a list of all of our available [facility], including address, phone number and hours of operation. Please do not delay care.  [facility]  If you or a family member do not have a primary care provider, use the link below to schedule a visit and establish care. When you choose a [facility] primary care physician or advanced practice provider, you gain a long-term partner in health. Find a Primary Care Provider  Learn more about [facility] in-office and virtual care options: [facility] - Get Care Now

## 2024-04-15 NOTE — Progress Notes (Signed)
 Virtual Visit Consent   Lisa Duncan, you are scheduled for a virtual visit with a [facility] provider today. Just as with appointments in the office, your consent must be obtained to participate. Your consent will be active for this visit and any virtual visit you may have with one of our providers in the next 365 days. If you have a MyChart account, a copy of this consent can be sent to you electronically.  As this is a virtual visit, video technology does not allow for your provider to perform a traditional examination. This may limit your provider's ability to fully assess your condition. If your provider identifies any concerns that need to be evaluated in person or the need to arrange testing (such as labs, EKG, etc.), we will make arrangements to do so. Although advances in technology are sophisticated, we cannot ensure that it will always work on either your end or our end. If the connection with a video visit is poor, the visit may have to be switched to a telephone visit. With either a video or telephone visit, we are not always able to ensure that we have a secure connection.  By engaging in this virtual visit, you consent to the provision of healthcare and authorize for your insurance to be billed (if applicable) for the services provided during this visit. Depending on your insurance coverage, you may receive a charge related to this service.  I need to obtain your verbal consent now. Are you willing to proceed with your visit today? [name] has provided verbal consent on 04/15/2024 for a virtual visit (video or telephone). Lisa CHRISTELLA Dickinson, PA-C  Date: 04/15/2024 7:53 AM   Virtual Visit via Video Note   I, Lisa Duncan, connected with  Lisa Duncan  ([id number], [birth year]) on 04/15/24 at  7:45 AM EDT by a video-enabled telemedicine application and verified that I am speaking with the correct person using two identifiers.  Location: Patient: Virtual Visit  Location Patient: Home Provider: Virtual Visit Location Provider: Home Office   I discussed the limitations of evaluation and management by telemedicine and the availability of in person appointments. The patient expressed understanding and agreed to proceed.    History of Present Illness: Lisa Duncan is a 28 y.o. who identifies as a female who was assigned female at birth, and is being seen today for bee sting. Was stung on Saturday, 04/13/24 in the right upper arm. Yesterday and today redness has spread around the lesion and it is very itchy and swollen. Area feels warm and hard due to reaction. She denies any throat swelling, mouth or tongue swelling, facial swelling, or changes in breathing.    Problems:  Patient Active Problem List   Diagnosis Date Noted   Class 3 severe obesity due to excess calories without serious comorbidity with body mass index (BMI) of 40.0 to 44.9 in adult 01/18/2024   Abnormal thyroid  function test 01/18/2024   Elevated liver enzymes 01/18/2024   Hepatic steatosis 01/18/2024   Obesity, morbid (HCC) 10/30/2023   COVID-19 vaccination declined 10/30/2023   Influenza vaccination declined 10/30/2023   Elevated liver function tests 10/30/2023   Left upper quadrant abdominal pain 10/30/2023   Jaw pain 10/30/2023   Sore throat 10/30/2023   Weight gain 10/30/2023   Situational anxiety 10/30/2023   Normal labor 04/16/2022   Difficulty with speech 08/06/2020   ADHD (attention deficit hyperactivity disorder), 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   Amoxicillin Nausea And Vomiting and Rash   Penicillins Nausea And Vomiting and Rash   Medications:  Current Outpatient Medications:    predniSONE  (DELTASONE ) 20 MG tablet, Take 2  tablets (40 mg total) by mouth daily with breakfast., Disp: 10 tablet, Rfl: 0   triamcinolone  cream (KENALOG ) 0.1 %, Apply 1 Application topically 2 (two) times daily., Disp: 30 g, Rfl: 0   hydrOXYzine  (ATARAX ) 10 MG tablet, Take 1 tablet (10 mg total) by mouth 3 (three) times daily as needed., Disp: 30 tablet, Rfl: 2   Norethindrone -Ethinyl Estradiol -Fe Biphas (LO LOESTRIN FE ) 1 MG-10 MCG / 10 MCG tablet, Take 1 tablet by mouth daily., Disp: , Rfl:    Semaglutide -Weight Management (WEGOVY ) 1.7 MG/0.75ML SOAJ, Inject 1.7 mg into the skin once a week., Disp: 3 mL, Rfl: 1  Observations/Objective: Patient is well-developed, well-nourished in no acute distress.  Resting comfortably at home.  Head is normocephalic, atraumatic.  No labored breathing.  Speech is clear and coherent with logical content.  Patient is alert and oriented at baseline.  Large, slightly raised, erythematous, pruritic patch noted on the right upper arm about a third of the way down from the shoulder  Assessment and Plan: 1. Redness (Primary) - predniSONE  (DELTASONE ) 20 MG tablet; Take 2 tablets (40 mg total) by mouth daily with breakfast.  Dispense: 10 tablet; Refill: 0 - triamcinolone  cream (KENALOG ) 0.1 %; Apply 1 Application topically 2 (two) times daily.  Dispense: 30 g; Refill: 0  2. Itching - predniSONE  (DELTASONE ) 20 MG tablet; Take 2 tablets (40 mg total) by mouth daily with breakfast.  Dispense: 10 tablet; Refill: 0 - triamcinolone  cream (KENALOG ) 0.1 %; Apply 1 Application topically 2 (two) times daily.  Dispense: 30 g; Refill: 0  3. Wasp sting, accidental or unintentional, initial encounter - predniSONE  (DELTASONE ) 20 MG tablet; Take 2 tablets (40 mg total) by mouth daily with breakfast.  Dispense: 10 tablet; Refill: 0 - triamcinolone  cream (KENALOG ) 0.1 %; Apply 1 Application topically 2 (two) times daily.  Dispense: 30 g; Refill: 0  - Localized histamine reaction at site of wasp sting - Add Prednisone  and  Triamcinolone  cream - May take a 24-hr non-drowsy antihistamine (claritin, allegra, zyrtec) with it  - Cold compresses - Less likely to be reactive cellulitis infection due to only happening 2 days ago, but have advised to monitor for symptoms to progress and not improve, if so to call back for follow up - Seek in person evaluation if worsening  Follow Up Instructions: I discussed the assessment and treatment plan with the patient. The patient was provided an opportunity to ask questions and all were answered. The patient agreed with the plan and demonstrated an understanding of the instructions.  A copy of instructions were sent to the patient via MyChart unless otherwise noted below.    The patient was advised to call back or seek an in-person evaluation if the symptoms worsen or if the condition fails to improve as anticipated.    Lisa CHRISTELLA Dickinson, PA-C

## 2024-04-28 ENCOUNTER — Ambulatory Visit: Payer: Self-pay | Admitting: Nurse Practitioner

## 2024-04-28 DIAGNOSIS — E66812 Obesity, class 2: Secondary | ICD-10-CM | POA: Insufficient documentation

## 2024-04-28 DIAGNOSIS — M5442 Lumbago with sciatica, left side: Secondary | ICD-10-CM | POA: Insufficient documentation

## 2024-04-28 DIAGNOSIS — Z0000 Encounter for general adult medical examination without abnormal findings: Secondary | ICD-10-CM | POA: Insufficient documentation

## 2024-04-28 DIAGNOSIS — K760 Fatty (change of) liver, not elsewhere classified: Secondary | ICD-10-CM

## 2024-04-28 DIAGNOSIS — N926 Irregular menstruation, unspecified: Secondary | ICD-10-CM | POA: Insufficient documentation

## 2024-04-28 NOTE — Assessment & Plan Note (Signed)
 Anxiety attacks in overstimulating environments. Clonazepam effective for acute relief, but controlled nature requires limited use. - Prescribe limited supply of clonazepam for acute anxiety attacks. - Discuss need for controlled substance agreement before prescribing clonazepam. - Consider alternative long-term anxiety management strategies if clonazepam is needed frequently.

## 2024-04-28 NOTE — Assessment & Plan Note (Signed)
 Slightly elevated thyroid  peroxidase. Will recheck levels

## 2024-04-28 NOTE — Assessment & Plan Note (Signed)
 She is encouraged to strive for BMI less than 30 to decrease cardiac risk. Advised to aim for at least 150 minutes of exercise per week. Continue Wegovy  doing well with weight loss, recent loss of 13 lbs.

## 2024-04-28 NOTE — Assessment & Plan Note (Signed)

## 2024-04-28 NOTE — Assessment & Plan Note (Signed)
 Severe left lower back pain with radiculopathy, likely due to sciatic nerve inflammation or injury. No relief with Advil  and Tylenol . - Administer Kenalog  injection. - Advise to contact clinic if symptoms improve but are not fully resolved for steroid taper consideration. - Order blood test to rule out pregnancy before continuing Wegovy .

## 2024-04-28 NOTE — Assessment & Plan Note (Signed)
 Weight management with Wegovy  successful, 13-pound loss. Adheres to high-protein, low-carb diet and regular exercise. Pap smear current, declined COVID-19 vaccine. - Continue current diet and exercise regimen. - Schedule next Pap smear for 2028. - Respect decision to decline COVID-19 vaccination.

## 2024-04-28 NOTE — Assessment & Plan Note (Signed)
 Will check beta hcg. Has had negative home pregnancy test.

## 2024-04-30 DIAGNOSIS — Z419 Encounter for procedure for purposes other than remedying health state, unspecified: Secondary | ICD-10-CM | POA: Diagnosis not present

## 2024-05-02 ENCOUNTER — Other Ambulatory Visit: Payer: Self-pay | Admitting: Nurse Practitioner

## 2024-05-02 DIAGNOSIS — R7989 Other specified abnormal findings of blood chemistry: Secondary | ICD-10-CM

## 2024-05-31 DIAGNOSIS — Z419 Encounter for procedure for purposes other than remedying health state, unspecified: Secondary | ICD-10-CM | POA: Diagnosis not present

## 2024-06-06 ENCOUNTER — Other Ambulatory Visit: Payer: Self-pay | Admitting: Nurse Practitioner

## 2024-06-27 ENCOUNTER — Ambulatory Visit: Admitting: Nurse Practitioner

## 2024-07-17 ENCOUNTER — Encounter: Payer: Self-pay | Admitting: Nurse Practitioner

## 2024-07-18 ENCOUNTER — Encounter: Payer: Self-pay | Admitting: Nurse Practitioner

## 2024-07-18 ENCOUNTER — Ambulatory Visit (INDEPENDENT_AMBULATORY_CARE_PROVIDER_SITE_OTHER): Admitting: Nurse Practitioner

## 2024-07-18 VITALS — BP 120/60 | HR 67 | Temp 98.3°F | Ht 63.0 in | Wt 213.4 lb

## 2024-07-18 DIAGNOSIS — E66812 Obesity, class 2: Secondary | ICD-10-CM | POA: Diagnosis not present

## 2024-07-18 DIAGNOSIS — R748 Abnormal levels of other serum enzymes: Secondary | ICD-10-CM

## 2024-07-18 DIAGNOSIS — E6609 Other obesity due to excess calories: Secondary | ICD-10-CM

## 2024-07-18 DIAGNOSIS — Z6837 Body mass index (BMI) 37.0-37.9, adult: Secondary | ICD-10-CM

## 2024-07-18 DIAGNOSIS — K760 Fatty (change of) liver, not elsewhere classified: Secondary | ICD-10-CM | POA: Diagnosis not present

## 2024-07-18 DIAGNOSIS — Z2821 Immunization not carried out because of patient refusal: Secondary | ICD-10-CM

## 2024-07-18 MED ORDER — OZEMPIC (0.25 OR 0.5 MG/DOSE) 2 MG/1.5ML ~~LOC~~ SOPN: 0.5000 mg | PEN_INJECTOR | SUBCUTANEOUS | 0 refills | Status: DC

## 2024-07-18 NOTE — Progress Notes (Signed)
 LILLETTE Kristeen JINNY Gladis, CMA,acting as a neurosurgeon for Lisa Ada, FNP.,have documented all relevant documentation on the behalf of Lisa Ada, FNP,as directed by  Lisa Ada, FNP while in the presence of Lisa Ada, FNP.  Subjective:  Patient ID: Lisa Duncan , female    DOB: 03-11-1996 , 28 y.o.   MRN: [id number]  Chief Complaint  Patient presents with   Obesity    Patient presents today for a weight follow up, Patient reports compliance with medication. Patient denies any chest pain, SOB, or headaches. Patient has no concerns today.     HPI  Exercising 4 days a week about 1 hour each time.  Diet: she is eating smaller portions throughout the day. She has cut back on her snacks. She took her last dose of Wegovy  was yesterday.     Discussed the use of AI scribe software for clinical note transcription with the patient, who gave verbal consent to proceed.  History of Present Illness Lisa Duncan is a 28 year old female who presents for a weight check.  She has experienced a weight loss of 15 pounds since May. She exercises about four days a week for approximately an hour each session. Her diet consists of smaller meals throughout the day, eating two to three times daily, and she has cut back on snacks. Her toddler often wants to eat what she eats, which has influenced her dietary choices.  She has been using Wegovy  for weight management but reports that her insurance no longer covers it. Her last dose was taken yesterday, and she has three pens of the one milligram Wegovy  remaining. She has not used phentermine in the past. She reports that she has noticed less abdominal pain and feels a difference compared to before May, when she was frequently concerned about eating due to stomach pain.  She has a history of fatty liver and mentions that her liver function levels were not good previously, partly due to frequent Tylenol  use. She has since stopped taking Tylenol  and is interested in  seeing if her liver function has improved. Her liver levels had decreased before but then increased again. She used to take Tylenol  regularly during high school due to sports and hiking, which may have contributed to her liver issues.   Past Medical History:  Diagnosis Date   ADHD    Allergy    Pine shavings, penicillins   Anxiety    Dysarthria    after having Covid-19 in January 2020   Headache      Family History  Problem Relation Age of Onset   Vision loss Mother    Anxiety disorder Mother    Depression Mother    Myasthenia gravis Father        20+ years ago   Diabetes Maternal Grandmother    Heart disease Maternal Grandmother    Cancer Maternal Grandmother    Depression Maternal Grandmother    Heart disease Maternal Grandfather    Breast cancer Paternal Grandmother        Age 45's   Cancer Paternal Grandmother    Heart disease Paternal Grandfather    Cancer Paternal Grandfather    ADD / ADHD Brother    Depression Brother    Anxiety disorder Sister    Cancer Maternal Aunt    Cancer Paternal Uncle    Depression Sister      Current Outpatient Medications:    hydrOXYzine  (ATARAX ) 10 MG tablet, Take 1 tablet (10 mg total) by mouth  3 (three) times daily as needed., Disp: 30 tablet, Rfl: 2   Norethindrone -Ethinyl Estradiol -Fe Biphas (LO LOESTRIN FE ) 1 MG-10 MCG / 10 MCG tablet, Take 1 tablet by mouth daily., Disp: , Rfl:    predniSONE  (DELTASONE ) 20 MG tablet, Take 2 tablets (40 mg total) by mouth daily with breakfast., Disp: 10 tablet, Rfl: 0   Semaglutide ,0.25 or 0.5MG /DOS, (OZEMPIC, 0.25 OR 0.5 MG/DOSE,) 2 MG/1.5ML SOPN, Inject 0.5 mg into the skin once a week., Disp: 4.5 mL, Rfl: 0   triamcinolone  cream (KENALOG ) 0.1 %, Apply 1 Application topically 2 (two) times daily., Disp: 30 g, Rfl: 0   Allergies  Allergen Reactions   Codeine Nausea And Vomiting   Hydrocodone     Altered mental status, extreme anxiety   Mushroom Extract Complex (Obsolete) Other (See Comments)     Mouth soreness.   Amoxicillin Nausea And Vomiting and Rash   Penicillins Nausea And Vomiting and Rash     Review of Systems  Constitutional:  Negative for fatigue.  Respiratory: Negative.    Cardiovascular: Negative.   Gastrointestinal:  Positive for nausea. Negative for abdominal distention, abdominal pain and vomiting.  Musculoskeletal: Negative.   Skin: Negative.   Neurological: Negative.   Psychiatric/Behavioral: Negative.       Today's Vitals   07/18/24 1134  BP: 120/60  Pulse: 67  Temp: 98.3 F (36.8 C)  TempSrc: Oral  Weight: 213 lb 6.4 oz (96.8 kg)  Height: 5' 3 (1.6 m)  PainSc: 0-No pain   Body mass index is 37.8 kg/m.  Wt Readings from Last 3 Encounters:  07/18/24 213 lb 6.4 oz (96.8 kg)  04/10/24 220 lb (99.8 kg)  01/18/24 228 lb 9.6 oz (103.7 kg)     Objective:  Physical Exam Vitals and nursing note reviewed.  Constitutional:      General: She is not in acute distress.    Appearance: Normal appearance. She is well-developed. She is obese.  Cardiovascular:     Rate and Rhythm: Normal rate and regular rhythm.     Pulses: Normal pulses.     Heart sounds: Normal heart sounds. No murmur heard. Pulmonary:     Effort: Pulmonary effort is normal. No respiratory distress.     Breath sounds: Normal breath sounds.  Chest:     Chest wall: No tenderness.  Musculoskeletal:        General: Normal range of motion.  Skin:    General: Skin is warm and dry.     Capillary Refill: Capillary refill takes less than 2 seconds.  Neurological:     General: No focal deficit present.     Mental Status: She is alert and oriented to person, place, and time.     Cranial Nerves: No cranial nerve deficit.  Psychiatric:        Mood and Affect: Mood normal.        Behavior: Behavior normal.        Thought Content: Thought content normal.        Judgment: Judgment normal.         Assessment And Plan:  Class 2 obesity due to excess calories with body mass index (BMI)  of 37.0 to 37.9 in adult, unspecified whether serious comorbidity present Assessment & Plan: BMI 38.0-38.9. Lost 15 pounds since May. Transitioning from Wegovy  to Ozempic due to insurance changes. Phentermine considered for affordability. - Use remaining three 1 mg Wegovy  pens, then transition to Ozempic 0.5 mg weekly as a sample. - Consider transitioning to  phentermine for continued weight management after Ozempic. - Continue current diet and exercise regimen.   Influenza vaccination declined  Hepatic steatosis Assessment & Plan: Less abdominal pain since Wegovy , suggesting improved liver function. Previous elevated tests due to Tylenol . Exploring Ozempic for fatty liver disease. - Order liver function tests to assess current status. - Attempt to obtain Ozempic for potential new indication for fatty liver disease. - Advise to use ibuprofen  or Aleve for pain management instead of Tylenol .  Orders: -     Ozempic (0.25 or 0.5 MG/DOSE); Inject 0.5 mg into the skin once a week.  Dispense: 4.5 mL; Refill: 0 -     Hepatic function panel  Elevated liver enzymes -     Hepatic function panel    Return for 2 months weight check.  Patient was given opportunity to ask questions. Patient verbalized understanding of the plan and was able to repeat key elements of the plan. All questions were answered to their satisfaction.    LILLETTE Lisa Ada, FNP, have reviewed all documentation for this visit. The documentation on 07/18/24 for the exam, diagnosis, procedures, and orders are all accurate and complete.   IF YOU HAVE BEEN REFERRED TO A SPECIALIST, IT MAY TAKE 1-2 WEEKS TO SCHEDULE/PROCESS THE REFERRAL. IF YOU HAVE NOT HEARD FROM US /SPECIALIST IN TWO WEEKS, PLEASE GIVE US  A CALL AT [phone].

## 2024-07-19 LAB — HEPATIC FUNCTION PANEL
ALT: 82 IU/L — ABNORMAL HIGH (ref 0–32)
AST: 45 IU/L — ABNORMAL HIGH (ref 0–40)
Albumin: 4.5 g/dL (ref 4.0–5.0)
Alkaline Phosphatase: 146 IU/L — ABNORMAL HIGH (ref 41–116)
Bilirubin Total: 0.8 mg/dL (ref 0.0–1.2)
Bilirubin, Direct: 0.21 mg/dL (ref 0.00–0.40)
Total Protein: 7.1 g/dL (ref 6.0–8.5)

## 2024-07-28 ENCOUNTER — Ambulatory Visit: Payer: Self-pay | Admitting: Nurse Practitioner

## 2024-07-28 DIAGNOSIS — E6609 Other obesity due to excess calories: Secondary | ICD-10-CM | POA: Insufficient documentation

## 2024-07-28 NOTE — Assessment & Plan Note (Signed)
 Less abdominal pain since Wegovy , suggesting improved liver function. Previous elevated tests due to Tylenol . Exploring Ozempic for fatty liver disease. - Order liver function tests to assess current status. - Attempt to obtain Ozempic for potential new indication for fatty liver disease. - Advise to use ibuprofen  or Aleve for pain management instead of Tylenol .

## 2024-07-28 NOTE — Assessment & Plan Note (Signed)
 BMI 38.0-38.9. Lost 15 pounds since May. Transitioning from Wegovy  to Ozempic due to insurance changes. Phentermine considered for affordability. - Use remaining three 1 mg Wegovy  pens, then transition to Ozempic 0.5 mg weekly as a sample. - Consider transitioning to phentermine for continued weight management after Ozempic. - Continue current diet and exercise regimen.

## 2024-09-17 ENCOUNTER — Ambulatory Visit (INDEPENDENT_AMBULATORY_CARE_PROVIDER_SITE_OTHER): Payer: Self-pay | Admitting: Nurse Practitioner

## 2024-09-17 ENCOUNTER — Encounter: Payer: Self-pay | Admitting: Nurse Practitioner

## 2024-09-17 VITALS — BP 110/70 | HR 75 | Temp 98.4°F | Ht 63.0 in | Wt 214.4 lb

## 2024-09-17 DIAGNOSIS — F418 Other specified anxiety disorders: Secondary | ICD-10-CM

## 2024-09-17 DIAGNOSIS — E66812 Obesity, class 2: Secondary | ICD-10-CM

## 2024-09-17 DIAGNOSIS — Z2821 Immunization not carried out because of patient refusal: Secondary | ICD-10-CM

## 2024-09-17 DIAGNOSIS — E6609 Other obesity due to excess calories: Secondary | ICD-10-CM

## 2024-09-17 DIAGNOSIS — Z6837 Body mass index (BMI) 37.0-37.9, adult: Secondary | ICD-10-CM

## 2024-09-17 DIAGNOSIS — K760 Fatty (change of) liver, not elsewhere classified: Secondary | ICD-10-CM

## 2024-09-17 MED ORDER — PROPRANOLOL HCL 10 MG PO TABS: 10.0000 mg | ORAL_TABLET | Freq: Two times a day (BID) | ORAL | 2 refills | Status: AC | PRN

## 2024-09-17 MED ORDER — HYDROXYZINE HCL 10 MG PO TABS: 10.0000 mg | ORAL_TABLET | Freq: Three times a day (TID) | ORAL | 2 refills | Status: AC | PRN

## 2024-09-17 NOTE — Progress Notes (Signed)
 LILLETTE Kristeen JINNY Gladis, CMA,acting as a neurosurgeon for Lisa Ada, FNP.,have documented all relevant documentation on the behalf of Lisa Ada, FNP,as directed by  Lisa Ada, FNP while in the presence of Lisa Ada, FNP.  Subjective:  Patient ID: Lisa Duncan , female    DOB: 11/13/1995 , 28 y.o.   MRN: [id number]  Chief Complaint  Patient presents with   Obesity    Patient presents today for a weight follow up, Patient reports compliance with medication. Patient denies any chest pain, SOB, or headaches. Patient has no concerns today.     Continues to exercise 3-4 times a week. She has been lifting more weight, she will do cardio 2-3 times a week. She is taking a women's daily vitamin, tumeric, liver support gummy, vitamin d. She feels good.     Discussed the use of AI scribe software for clinical note transcription with the patient, who gave verbal consent to proceed.  History of Present Illness Lisa Duncan is a 28 year old female who presents for a weight check and management of anxiety.  She is not currently on any weight loss medication and maintains her weight through regular exercise and a healthy diet. She exercises at least three to four times a week, incorporating weight lifting to build muscle mass and two to three days of cardio. Her last recorded weight was 213 pounds in October. She takes a women's daily vitamin, turmeric, liver support gummies, and vitamin D supplements.  She experiences increased anxiety, particularly around the holidays and when her routine is disrupted. Her anxiety is sometimes triggered by feeling overwhelmed and when her son is away from her. She has used hydroxyzine  (Atarax ) in the past, which takes time to help her. She experiences episodes where her heart feels like it is pounding out of her chest, which exacerbates her anxiety. She has seen a veterinary surgeon. She has previously discussed the use of clonazepam for anxiety but has not used it due to concerns  about dependence.  Past Medical History:  Diagnosis Date   ADHD    Allergy    Pine shavings, penicillins   Anxiety    Dysarthria    after having Covid-19 in January 2020   Headache      Family History  Problem Relation Age of Onset   Vision loss Mother    Anxiety disorder Mother    Depression Mother    Myasthenia gravis Father        20+ years ago   Diabetes Maternal Grandmother    Heart disease Maternal Grandmother    Cancer Maternal Grandmother    Depression Maternal Grandmother    Heart disease Maternal Grandfather    Breast cancer Paternal Grandmother        Age 14's   Cancer Paternal Grandmother    Heart disease Paternal Grandfather    Cancer Paternal Grandfather    ADD / ADHD Brother    Depression Brother    Anxiety disorder Sister    Cancer Maternal Aunt    Cancer Paternal Uncle    Depression Sister     Current Medications[1]   Allergies[2]   Review of Systems  Constitutional:  Negative for fatigue.  Respiratory: Negative.    Cardiovascular: Negative.   Gastrointestinal:  Negative for abdominal distention, abdominal pain, nausea and vomiting.  Musculoskeletal: Negative.   Skin: Negative.   Neurological: Negative.   Psychiatric/Behavioral: Negative.       Today's Vitals   09/17/24 1139  BP: 110/70  Pulse: 75  Temp: 98.4 F (36.9 C)  TempSrc: Oral  Weight: 214 lb 6.4 oz (97.3 kg)  Height: 5' 3 (1.6 m)  PainSc: 0-No pain   Body mass index is 37.98 kg/m.  Wt Readings from Last 3 Encounters:  09/17/24 214 lb 6.4 oz (97.3 kg)  07/18/24 213 lb 6.4 oz (96.8 kg)  04/10/24 220 lb (99.8 kg)     Objective:  Physical Exam Vitals and nursing note reviewed.  Constitutional:      General: She is not in acute distress.    Appearance: Normal appearance. She is well-developed. She is obese.  Cardiovascular:     Rate and Rhythm: Normal rate and regular rhythm.     Pulses: Normal pulses.     Heart sounds: Normal heart sounds. No murmur  heard. Pulmonary:     Effort: Pulmonary effort is normal. No respiratory distress.     Breath sounds: Normal breath sounds.  Chest:     Chest wall: No tenderness.  Musculoskeletal:        General: Normal range of motion.  Skin:    General: Skin is warm and dry.     Capillary Refill: Capillary refill takes less than 2 seconds.  Neurological:     General: No focal deficit present.     Mental Status: She is alert and oriented to person, place, and time.     Cranial Nerves: No cranial nerve deficit.  Psychiatric:        Mood and Affect: Mood normal.        Behavior: Behavior normal.        Thought Content: Thought content normal.        Judgment: Judgment normal.        09/17/2024   12:13 PM 04/10/2024   10:43 AM 10/30/2023    8:52 AM  GAD 7 : Generalized Anxiety Score  Nervous, Anxious, on Edge 1 1 2   Control/stop worrying 1 0 0  Worry too much - different things 1 0 0  Trouble relaxing 0 1 1  Restless 0 0 1  Easily annoyed or irritable 0 0 0  Afraid - awful might happen 0 0 1  Total GAD 7 Score 3 2 5   Anxiety Difficulty Not difficult at all Somewhat difficult Somewhat difficult     Assessment And Plan:   Assessment & Plan Situational anxiety Anxiety exacerbated by holidays and family events. Hydroxyzine  used with delayed effect. Propranolol  discussed as a safer option for anxiety-related tachycardia. - Prescribed propranolol  twice daily as needed for anxiety-related tachycardia. - Continue hydroxyzine  as needed for sleep. - Scheduled follow-up in three months to assess anxiety management. Class 2 obesity due to excess calories with body mass index (BMI) of 37.0 to 37.9 in adult, unspecified whether serious comorbidity present Maintaining weight through exercise and diet. Considering berberine for glucose control and weight loss. Prefers non-medication management for now. - Continue current exercise and diet regimen. - Consider adding berberine for glucose control and  weight loss. - Monitor weight and consider follow-up if oral weight loss medication is initiated. Influenza vaccination declined  Hepatic steatosis Taking liver support gummies as a supplement. - Continue liver support gummies.  No orders of the defined types were placed in this encounter.    Return for keep same next; 3 month f/u anxiety.  Patient was given opportunity to ask questions. Patient verbalized understanding of the plan and was able to repeat key elements of the plan. All questions were answered to their  satisfaction.    LILLETTE Lisa Ada, FNP, have reviewed all documentation for this visit. The documentation on 09/17/2024 for the exam, diagnosis, procedures, and orders are all accurate and complete.   IF YOU HAVE BEEN REFERRED TO A SPECIALIST, IT MAY TAKE 1-2 WEEKS TO SCHEDULE/PROCESS THE REFERRAL. IF YOU HAVE NOT HEARD FROM US /SPECIALIST IN TWO WEEKS, PLEASE GIVE US  A CALL AT [phone] X [phone].      [1]  Current Outpatient Medications:    Norethindrone -Ethinyl Estradiol -Fe Biphas (LO LOESTRIN FE ) 1 MG-10 MCG / 10 MCG tablet, Take 1 tablet by mouth daily., Disp: , Rfl:    propranolol  (INDERAL ) 10 MG tablet, Take 1 tablet (10 mg total) by mouth 2 (two) times daily as needed., Disp: 60 tablet, Rfl: 2   triamcinolone  cream (KENALOG ) 0.1 %, Apply 1 Application topically 2 (two) times daily., Disp: 30 g, Rfl: 0   hydrOXYzine  (ATARAX ) 10 MG tablet, Take 1 tablet (10 mg total) by mouth 3 (three) times daily as needed., Disp: 30 tablet, Rfl: 2 [2]  Allergies Allergen Reactions   Codeine Nausea And Vomiting   Hydrocodone     Altered mental status, extreme anxiety   Mushroom Extract Complex (Obsolete) Other (See Comments)    Mouth soreness.   Amoxicillin Nausea And Vomiting and Rash   Penicillins Nausea And Vomiting and Rash

## 2024-09-29 NOTE — Assessment & Plan Note (Signed)
 Maintaining weight through exercise and diet. Considering berberine for glucose control and weight loss. Prefers non-medication management for now. - Continue current exercise and diet regimen. - Consider adding berberine for glucose control and weight loss. - Monitor weight and consider follow-up if oral weight loss medication is initiated.

## 2024-09-29 NOTE — Assessment & Plan Note (Signed)
 Taking liver support gummies as a supplement. - Continue liver support gummies.

## 2024-09-29 NOTE — Assessment & Plan Note (Signed)
 Anxiety exacerbated by holidays and family events. Hydroxyzine  used with delayed effect. Propranolol  discussed as a safer option for anxiety-related tachycardia. - Prescribed propranolol  twice daily as needed for anxiety-related tachycardia. - Continue hydroxyzine  as needed for sleep. - Scheduled follow-up in three months to assess anxiety management.

## 2024-10-09 ENCOUNTER — Ambulatory Visit (INDEPENDENT_AMBULATORY_CARE_PROVIDER_SITE_OTHER): Payer: Self-pay | Admitting: Family Medicine

## 2024-10-09 ENCOUNTER — Encounter: Payer: Self-pay | Admitting: Family Medicine

## 2024-10-09 VITALS — BP 120/76 | HR 84 | Temp 98.2°F | Ht 63.0 in | Wt 223.0 lb

## 2024-10-09 DIAGNOSIS — M62830 Muscle spasm of back: Secondary | ICD-10-CM

## 2024-10-09 DIAGNOSIS — M5442 Lumbago with sciatica, left side: Secondary | ICD-10-CM

## 2024-10-09 DIAGNOSIS — G8929 Other chronic pain: Secondary | ICD-10-CM

## 2024-10-09 MED ORDER — TRIAMCINOLONE ACETONIDE 40 MG/ML IJ SUSP
60.0000 mg | Freq: Once | INTRAMUSCULAR | Status: AC
  Administered 2024-10-09: 60 mg via INTRAMUSCULAR

## 2024-10-09 MED ORDER — METHOCARBAMOL 750 MG PO TABS: 750.0000 mg | ORAL_TABLET | Freq: Two times a day (BID) | ORAL | 0 refills | Status: AC | PRN

## 2024-10-09 MED ORDER — KETOROLAC TROMETHAMINE 30 MG/ML IJ SOLN
30.0000 mg | Freq: Once | INTRAMUSCULAR | Status: AC
  Administered 2024-10-09: 30 mg via INTRAMUSCULAR

## 2024-10-09 NOTE — Progress Notes (Signed)
 I,[name], CMA,acting as a neurosurgeon for [employer], NP.,have documented all relevant documentation on the behalf of [name], NP,as directed by  [name], NP while in the presence of [name], NP.  Subjective:  Patient ID: Lisa Duncan , female    DOB: 12-Jul-1996 , 29 y.o.   MRN: [id number]  Chief Complaint  Patient presents with   Back Pain    Patient presents today for back pain. Patient reports the pain is on the lower left side. She reports her back has hurt like this previously. She reports when her back hurts it is hard for her to walk or lift anything. She reports the pain radiates down her leg.    HPI Discussed the use of AI scribe software for clinical note transcription with the patient, who gave verbal consent to proceed.  History of Present Illness      Lisa Duncan is a 29 year old female who presents with lower back pain radiating to the left thigh and ankle.  She experiences sharp, shooting pain in her lower left back that radiates to the front of her left thigh and down to her ankle, but not into her foot. The pain intensifies with movement and has been present since Monday, October 07, 2024. She rates the pain as an 8 out of 10 on the pain scale.  This is not the first occurrence of such pain. She previously experienced similar symptoms in July 2025 after jumping off a porch to catch her dog. Prior to that, in 2022, she had a snowboarding accident where she landed on her tailbone, which was the initial incident leading to her back issues. She has undergone x-rays and an MRI in the past. She has also visited a chiropractor for her back pain.  Currently, she is taking 600 mg of ibuprofen , which provides some relief, but the pain persists, especially when walking. She describes difficulty standing up straight due to the pain and compensates by leaning to the right side. In July 2025, she received a Kenalog  steroid injection for her back pain, which  provided relief at that time.  She has not seen an orthopedic doctor and has not taken muscle relaxants due to concerns about addiction, although she is open to non-addictive options.  She is trying to lose weight and was previously on Wegovy  from February 2025 to October 2025, during which she lost 20-25 pounds. She has been off Wegovy  since October 2025 due to cost concerns.      Past Medical History:  Diagnosis Date   ADHD    Allergy    Pine shavings, penicillins   Anxiety    Dysarthria    after having Covid-19 in January 2020   Headache      Family History  Problem Relation Age of Onset   Vision loss Mother    Anxiety disorder Mother    Depression Mother    Myasthenia gravis Father        20+ years ago   Diabetes Maternal Grandmother    Heart disease Maternal Grandmother    Cancer Maternal Grandmother    Depression Maternal Grandmother    Heart disease Maternal Grandfather    Breast cancer Paternal Grandmother        Age 89's   Cancer Paternal Grandmother    Heart disease Paternal Grandfather    Cancer Paternal Grandfather    ADD / ADHD Brother    Depression Brother    Anxiety disorder Sister  Cancer Maternal Aunt    Cancer Paternal Uncle    Depression Sister     Current Medications[1]   Allergies[2]   Review of Systems  Constitutional: Negative.   Eyes: Negative.   Respiratory: Negative.    Cardiovascular: Negative.   Musculoskeletal:  Positive for back pain.  Skin: Negative.   Psychiatric/Behavioral: Negative.       Today's Vitals   10/09/24 1206  BP: 120/76  Pulse: 84  Temp: 98.2 F (36.8 C)  TempSrc: Oral  Weight: 223 lb (101.2 kg)  Height: 5' 3 (1.6 m)  PainSc: 8   PainLoc: Back   Body mass index is 39.5 kg/m.  Wt Readings from Last 3 Encounters:  10/09/24 223 lb (101.2 kg)  09/17/24 214 lb 6.4 oz (97.3 kg)  07/18/24 213 lb 6.4 oz (96.8 kg)    The ASCVD Risk score (Arnett DK, et al., 2019) failed to calculate for the following  reasons:   The 2019 ASCVD risk score is only valid for ages 51 to 26   * - Cholesterol units were assumed  Objective:  Physical Exam Constitutional:      Appearance: Normal appearance.  Cardiovascular:     Rate and Rhythm: Normal rate and regular rhythm.     Pulses: Normal pulses.     Heart sounds: Normal heart sounds.  Pulmonary:     Effort: Pulmonary effort is normal.     Breath sounds: Normal breath sounds.  Abdominal:     General: Bowel sounds are normal.  Musculoskeletal:        General: Tenderness present.  Neurological:     Mental Status: She is alert and oriented to person, place, and time.         Assessment And Plan:   Assessment & Plan Chronic left-sided low back pain with left-sided sciatica Chronic low back pain with sciatica, exacerbated recently. Previous imaging negative for fractures. Ibuprofen  provides partial relief. Previous Kenalog  injection effective. Prefers injection over orthopedic referral. Spasm of back muscles - Prescribed muscle relaxant for cramping as needed. - Refer to orthopedic specialist if symptoms persist. Obesity, morbid (HCC) Recent weight gain of 10 pounds. Previously lost 20-25 pounds on Wegovy .  Return if symptoms worsen or fail to improve, for keep next appt.  Patient was given opportunity to ask questions. Patient verbalized understanding of the plan and was able to repeat key elements of the plan. All questions were answered to their satisfaction.    I, [name], NP, have reviewed all documentation for this visit. The documentation on 10/14/2024 for the exam, diagnosis, procedures, and orders are all accurate and complete.   IF YOU HAVE BEEN REFERRED TO A SPECIALIST, IT MAY TAKE 1-2 WEEKS TO SCHEDULE/PROCESS THE REFERRAL. IF YOU HAVE NOT HEARD FROM US /SPECIALIST IN TWO WEEKS, PLEASE GIVE US  A CALL AT [phone] X [phone].      [1]  Current Outpatient Medications:    hydrOXYzine  (ATARAX ) 10 MG tablet, Take 1 tablet (10 mg  total) by mouth 3 (three) times daily as needed., Disp: 30 tablet, Rfl: 2   methocarbamol  (ROBAXIN ) 750 MG tablet, Take 1 tablet (750 mg total) by mouth 2 (two) times daily as needed for muscle spasms., Disp: 30 tablet, Rfl: 0   Norethindrone -Ethinyl Estradiol -Fe Biphas (LO LOESTRIN FE ) 1 MG-10 MCG / 10 MCG tablet, Take 1 tablet by mouth daily., Disp: , Rfl:    propranolol  (INDERAL ) 10 MG tablet, Take 1 tablet (10 mg total) by mouth 2 (two) times daily as needed.,  Disp: 60 tablet, Rfl: 2   triamcinolone  cream (KENALOG ) 0.1 %, Apply 1 Application topically 2 (two) times daily., Disp: 30 g, Rfl: 0 [2]  Allergies Allergen Reactions   Codeine Nausea And Vomiting   Hydrocodone     Altered mental status, extreme anxiety   Mushroom Extract Complex (Obsolete) Other (See Comments)    Mouth soreness.   Amoxicillin Nausea And Vomiting and Rash   Penicillins Nausea And Vomiting and Rash

## 2024-10-14 NOTE — Assessment & Plan Note (Signed)
 Chronic low back pain with sciatica, exacerbated recently. Previous imaging negative for fractures. Ibuprofen  provides partial relief. Previous Kenalog  injection effective. Prefers injection over orthopedic referral.

## 2024-10-14 NOTE — Assessment & Plan Note (Signed)
 Recent weight gain of 10 pounds. Previously lost 20-25 pounds on Wegovy .

## 2024-10-14 NOTE — Assessment & Plan Note (Signed)
-   Prescribed muscle relaxant for cramping as needed. - Refer to orthopedic specialist if symptoms persist.

## 2025-04-15 ENCOUNTER — Encounter: Payer: Self-pay | Admitting: Nurse Practitioner

## 2025-10-09 ENCOUNTER — Ambulatory Visit: Payer: Self-pay | Admitting: Family Medicine
